# Patient Record
Sex: Male | Born: 1945
Health system: Southern US, Community
[De-identification: ages and names within clinical notes are randomized; demographics above are authoritative.]

## PROBLEM LIST (undated history)

## (undated) DIAGNOSIS — M5412 Radiculopathy, cervical region: Secondary | ICD-10-CM

## (undated) DIAGNOSIS — Z86010 Personal history of colonic polyps: Secondary | ICD-10-CM

## (undated) DIAGNOSIS — M1990 Unspecified osteoarthritis, unspecified site: Secondary | ICD-10-CM

## (undated) DIAGNOSIS — R7611 Nonspecific reaction to tuberculin skin test without active tuberculosis: Secondary | ICD-10-CM

## (undated) DIAGNOSIS — Z860101 Personal history of adenomatous and serrated colon polyps: Secondary | ICD-10-CM

## (undated) DIAGNOSIS — R310 Gross hematuria: Secondary | ICD-10-CM

## (undated) DIAGNOSIS — I219 Acute myocardial infarction, unspecified: Secondary | ICD-10-CM

## (undated) DIAGNOSIS — I10 Essential (primary) hypertension: Secondary | ICD-10-CM

## (undated) DIAGNOSIS — J302 Other seasonal allergic rhinitis: Secondary | ICD-10-CM

## (undated) DIAGNOSIS — E785 Hyperlipidemia, unspecified: Secondary | ICD-10-CM

## (undated) DIAGNOSIS — H269 Unspecified cataract: Secondary | ICD-10-CM

## (undated) DIAGNOSIS — M5090 Cervical disc disorder, unspecified, unspecified cervical region: Secondary | ICD-10-CM

## (undated) DIAGNOSIS — R3915 Urgency of urination: Secondary | ICD-10-CM

## (undated) DIAGNOSIS — I2510 Atherosclerotic heart disease of native coronary artery without angina pectoris: Secondary | ICD-10-CM

## (undated) DIAGNOSIS — Z955 Presence of coronary angioplasty implant and graft: Secondary | ICD-10-CM

## (undated) DIAGNOSIS — I252 Old myocardial infarction: Secondary | ICD-10-CM

## (undated) DIAGNOSIS — N323 Diverticulum of bladder: Secondary | ICD-10-CM

## (undated) DIAGNOSIS — T7840XA Allergy, unspecified, initial encounter: Secondary | ICD-10-CM

## (undated) DIAGNOSIS — R011 Cardiac murmur, unspecified: Secondary | ICD-10-CM

## (undated) HISTORY — PX: TONSILLECTOMY AND ADENOIDECTOMY: SUR1326

## (undated) HISTORY — DX: Acute myocardial infarction, unspecified: I21.9

## (undated) HISTORY — DX: Allergy, unspecified, initial encounter: T78.40XA

## (undated) HISTORY — DX: Unspecified cataract: H26.9

## (undated) HISTORY — PX: COLONOSCOPY: SHX174

## (undated) HISTORY — DX: Cervical disc disorder, unspecified, unspecified cervical region: M50.90

## (undated) HISTORY — PX: CORONARY ANGIOPLASTY WITH STENT PLACEMENT: SHX49

## (undated) HISTORY — DX: Nonspecific reaction to tuberculin skin test without active tuberculosis: R76.11

## (undated) HISTORY — DX: Hyperlipidemia, unspecified: E78.5

## (undated) HISTORY — PX: CATARACT EXTRACTION W/ INTRAOCULAR LENS  IMPLANT, BILATERAL: SHX1307

## (undated) HISTORY — DX: Atherosclerotic heart disease of native coronary artery without angina pectoris: I25.10

## (undated) HISTORY — DX: Personal history of colonic polyps: Z86.010

---

## 1970-06-11 HISTORY — PX: WISDOM TOOTH EXTRACTION: SHX21

## 2001-05-14 ENCOUNTER — Encounter: Payer: Self-pay | Admitting: Neurology

## 2001-05-14 ENCOUNTER — Ambulatory Visit (HOSPITAL_COMMUNITY): Admission: RE | Admit: 2001-05-14 | Discharge: 2001-05-14 | Payer: Self-pay | Admitting: Neurology

## 2004-02-16 ENCOUNTER — Encounter: Admission: RE | Admit: 2004-02-16 | Discharge: 2004-02-16 | Payer: Self-pay | Admitting: Family Medicine

## 2006-02-20 ENCOUNTER — Encounter: Payer: Self-pay | Admitting: Family Medicine

## 2006-02-21 ENCOUNTER — Ambulatory Visit: Payer: Self-pay | Admitting: Family Medicine

## 2006-03-01 ENCOUNTER — Ambulatory Visit: Payer: Self-pay | Admitting: Gastroenterology

## 2008-04-22 ENCOUNTER — Ambulatory Visit: Payer: Self-pay | Admitting: Internal Medicine

## 2008-04-22 LAB — CONVERTED CEMR LAB
ALT: 30 units/L (ref 0–53)
AST: 26 units/L (ref 0–37)
Albumin: 4.1 g/dL (ref 3.5–5.2)
Alkaline Phosphatase: 34 units/L — ABNORMAL LOW (ref 39–117)
Bilirubin, Direct: 0.1 mg/dL (ref 0.0–0.3)
Hgb A1c MFr Bld: 5.7 % (ref 4.6–6.0)
PSA: 1.79 ng/mL (ref 0.10–4.00)
Total Bilirubin: 0.9 mg/dL (ref 0.3–1.2)
Total Protein: 6.8 g/dL (ref 6.0–8.3)

## 2009-02-11 ENCOUNTER — Telehealth: Payer: Self-pay | Admitting: Family Medicine

## 2010-01-27 ENCOUNTER — Encounter: Payer: Self-pay | Admitting: Family Medicine

## 2010-02-22 ENCOUNTER — Ambulatory Visit: Payer: Self-pay | Admitting: Family Medicine

## 2010-02-22 ENCOUNTER — Encounter: Payer: Self-pay | Admitting: Family Medicine

## 2010-02-22 DIAGNOSIS — J209 Acute bronchitis, unspecified: Secondary | ICD-10-CM | POA: Insufficient documentation

## 2010-02-22 LAB — CONVERTED CEMR LAB
Basophils Absolute: 0 10*3/uL (ref 0.0–0.1)
Basophils Relative: 0.3 % (ref 0.0–3.0)
Eosinophils Absolute: 0.2 10*3/uL (ref 0.0–0.7)
Eosinophils Relative: 2.8 % (ref 0.0–5.0)
HCT: 41.1 % (ref 39.0–52.0)
Hemoglobin: 14.4 g/dL (ref 13.0–17.0)
Lymphocytes Relative: 19.8 % (ref 12.0–46.0)
Lymphs Abs: 1.5 10*3/uL (ref 0.7–4.0)
MCHC: 34.9 g/dL (ref 30.0–36.0)
MCV: 93.5 fL (ref 78.0–100.0)
Monocytes Absolute: 0.9 10*3/uL (ref 0.1–1.0)
Monocytes Relative: 11.1 % (ref 3.0–12.0)
Neutro Abs: 5.1 10*3/uL (ref 1.4–7.7)
Neutrophils Relative %: 66 % (ref 43.0–77.0)
Platelets: 240 10*3/uL (ref 150.0–400.0)
RBC: 4.4 M/uL (ref 4.22–5.81)
RDW: 13.8 % (ref 11.5–14.6)
WBC: 7.8 10*3/uL (ref 4.5–10.5)

## 2010-03-30 ENCOUNTER — Encounter: Payer: Self-pay | Admitting: Physician Assistant

## 2010-03-30 ENCOUNTER — Encounter: Payer: Self-pay | Admitting: Family Medicine

## 2010-06-09 ENCOUNTER — Encounter: Payer: Self-pay | Admitting: Internal Medicine

## 2010-06-09 ENCOUNTER — Ambulatory Visit
Admission: RE | Admit: 2010-06-09 | Discharge: 2010-06-09 | Payer: Self-pay | Source: Home / Self Care | Attending: Internal Medicine | Admitting: Internal Medicine

## 2010-07-11 NOTE — Miscellaneous (Signed)
Summary: Orders Update  Clinical Lists Changes  Problems: Added new problem of ACUTE BRONCHITIS (ICD-466.0) Orders: Added new Test order of T-2 View CXR (71020TC) - Signed

## 2010-07-11 NOTE — Miscellaneous (Signed)
Summary: Orders Update  Clinical Lists Changes  Medications: Added new medication of PREDNISONE 10 MG TABS (PREDNISONE) as directed - Signed Rx of PREDNISONE 10 MG TABS (PREDNISONE) as directed;  #30 x 0;  Signed;  Entered by: Loreen Freud DO;  Authorized by: Loreen Freud DO;  Method used: Electronically to [facility] [address], [place], [place], [place]  [address], Ph: [phone], Fax: [phone]    Prescriptions: PREDNISONE 10 MG TABS (PREDNISONE) as directed  #30 x 0   Entered and Authorized by:   Loreen Freud DO   Signed by:   Loreen Freud DO on 01/27/2010   Method used:   Electronically to        [facility]       [address]       [place], [place]  [address]       Ph: [phone]       Fax: [phone]   RxID:   [id number]

## 2010-07-11 NOTE — Miscellaneous (Signed)
Summary: Orders Update  Clinical Lists Changes  Problems: Added new problem of PREVENTIVE HEALTH CARE (ICD-V70.0) Orders: Added new Service order of Venipuncture (46962) - Signed Added new Test order of T- * Misc. Laboratory test [id number]) - Signed

## 2010-07-13 NOTE — Assessment & Plan Note (Signed)
Summary: Shingles Vaccine  Nurse Visit   Immunizations Administered:  Zostavax # 1:    Vaccine Type: Zostavax    Site: Left Arm    Mfr: Merck    Dose: 0.64mL    Route: Ward    Given by: Shonna Chock CMA    Exp. Date: 04/12/2011    Lot #: [id number]    VIS given: 03/23/05 given June 09, 2010.  Orders Added: 1)  Zoster (Shingles) Vaccine Live [[id number]] 2)  Admin 1st Vaccine [phone]

## 2010-10-27 NOTE — Assessment & Plan Note (Signed)
[facility]                           GASTROENTEROLOGY OFFICE NOTE   Robert Owen                       MRN:          [id number]  DATE:03/01/2006                            DOB:          Jul 14, 1945    PROBLEM:  Rectal mass.   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  He has no rectal pain or bleeding.  He underwent  colonoscopy on 2005 that was entirely normal.   PAST MEDICAL HISTORY:  Pertinent for hypertension.   FAMILY HISTORY:  Noncontributory.   MEDICATIONS:  Include Lipitor and a multivitamin.   ALLERGIES:  He had no allergies to oral medications.   He does not smoke.  He drinks occasionally.  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   REVIEW OF SYSTEMS:  Was reviewed, and is negative.   PHYSICAL EXAMINATION:  Pulse 68, blood pressure 100/50, weight 161.  HEENT: EOMI. PERRLA. Sclerae are anicteric.  Conjunctivae are pink.  NECK:  Supple without thyromegaly, adenopathy or carotid bruits.  CHEST:  Clear to auscultation and percussion without adventitious sounds.  CARDIAC:  Regular rhythm; normal S1 S2.  There are no murmurs, gallops or  rubs.  ABDOMEN:  Bowel sounds are normoactive.  Abdomen is soft, non-tender and non-  distended.  There are no abdominal masses, tenderness, splenic enlargement  or hepatomegaly.  EXTREMITIES:  Full range of motion.  No cyanosis, clubbing or edema.  RECTAL:  There are no masses.  Stool is Hemoccult negative.   Anoscopy was performed, and demonstrated a hemorrhoid at approximately 5  o'clock.   IMPRESSION:  Asymptomatic hemorrhoid.   RECOMMENDATION:  Anusol suppositories as needed.                                  [name]. Arlyce Dice, MD,FACG   RDK/MedQ  DD:  03/01/2006  DT:  03/03/2006  Job #:  [id number]

## 2010-10-28 ENCOUNTER — Ambulatory Visit (INDEPENDENT_AMBULATORY_CARE_PROVIDER_SITE_OTHER): Payer: Self-pay | Admitting: Family Medicine

## 2010-10-28 VITALS — BP 150/66 | HR 63 | Temp 97.6°F | Wt 167.0 lb

## 2010-10-28 DIAGNOSIS — M25559 Pain in unspecified hip: Secondary | ICD-10-CM

## 2010-10-28 DIAGNOSIS — M79609 Pain in unspecified limb: Secondary | ICD-10-CM

## 2010-10-28 DIAGNOSIS — M79651 Pain in right thigh: Secondary | ICD-10-CM

## 2010-10-28 MED ORDER — PREDNISONE (PAK) 10 MG PO TABS: ORAL_TABLET | ORAL | Status: DC

## 2010-10-28 NOTE — Patient Instructions (Signed)
We will call you regarding MRI appointment.

## 2010-10-29 NOTE — Progress Notes (Signed)
  Subjective:    Patient ID: Robert Owen, male    DOB: 09/11/1945, 65 y.o.   MRN: [id number]  HPI Patient seen with 2 month history of progressive R lateral hip pain.  No known injury.  Achy to sharp quality of pain R lateral hip and R lateral thigh and R lateral/volar foot near mid aspect.  Symptoms seem worse at night.  Pain first noted after riding stationary bike.  Worse R lateral decubitus.  Has tried Celebrex, Tylenol, and ASA without relief.  Pain seems to be progressing.  No associate appetite or weight changes.  No weakness.   Denies fever, chills, urine or stool incontinence.  No prior history of lumbar disc problems.  Review of Systems  Constitutional: Negative for fever, chills, activity change, appetite change and unexpected weight change.  Cardiovascular: Negative for leg swelling.  Genitourinary: Negative for dysuria and hematuria.  Neurological: Negative for weakness.  Hematological: Negative for adenopathy. Does not bruise/bleed easily.       Objective:   Physical Exam  Constitutional: He appears well-developed and well-nourished.  Cardiovascular: Normal rate and regular rhythm.   Musculoskeletal: He exhibits no edema.       Full ROM R hip.  Slighlty tender lateral hip over trochanteric bursa.  SLRs negative.  No muscle atrophy.  Neurological:       Normal sensory to touch.  Full strength with hip flexion, plantar and dorsiflexion.  DTRs 2 plus knee and ankle.  Skin: No rash noted.          Assessment & Plan:  R lower extremity pain.  Predominantly R lateral hip but also mid R thigh and foot suggests lumbar nerve root impingement.  No suspicion for R hip intraarticular pathology.  No focal weakness or atrophy at this time but symptoms are progressing.  Prednisone taper and MRI Lumbar spine.

## 2010-10-30 ENCOUNTER — Telehealth: Payer: Self-pay | Admitting: *Deleted

## 2010-10-30 DIAGNOSIS — M25559 Pain in unspecified hip: Secondary | ICD-10-CM

## 2010-10-30 NOTE — Telephone Encounter (Signed)
Dr Caryl Never ordered at  [facility], Camelia Eng did not receive the order/referral

## 2010-11-02 ENCOUNTER — Ambulatory Visit
Admission: RE | Admit: 2010-11-02 | Discharge: 2010-11-02 | Disposition: A | Discharge status: Home / Self Care | Payer: Commercial Managed Care - PPO | Source: Ambulatory Visit | Attending: Family Medicine | Admitting: Family Medicine

## 2010-11-02 DIAGNOSIS — M25559 Pain in unspecified hip: Secondary | ICD-10-CM

## 2011-02-07 ENCOUNTER — Encounter: Payer: Self-pay | Admitting: Gastroenterology

## 2011-06-12 DIAGNOSIS — I219 Acute myocardial infarction, unspecified: Secondary | ICD-10-CM

## 2011-06-12 HISTORY — DX: Acute myocardial infarction, unspecified: I21.9

## 2011-09-06 ENCOUNTER — Encounter: Payer: Self-pay | Admitting: Physician Assistant

## 2011-09-25 ENCOUNTER — Other Ambulatory Visit (INDEPENDENT_AMBULATORY_CARE_PROVIDER_SITE_OTHER): Payer: Commercial Managed Care - PPO

## 2011-09-25 DIAGNOSIS — R7309 Other abnormal glucose: Secondary | ICD-10-CM

## 2011-09-25 DIAGNOSIS — E119 Type 2 diabetes mellitus without complications: Secondary | ICD-10-CM

## 2011-10-20 ENCOUNTER — Encounter: Payer: Self-pay | Admitting: Physician Assistant

## 2011-10-21 ENCOUNTER — Encounter: Payer: Self-pay | Admitting: Physician Assistant

## 2011-10-22 ENCOUNTER — Encounter: Payer: Self-pay | Admitting: Physician Assistant

## 2011-10-23 ENCOUNTER — Encounter: Payer: Self-pay | Admitting: Internal Medicine

## 2011-10-23 DIAGNOSIS — R7309 Other abnormal glucose: Secondary | ICD-10-CM | POA: Insufficient documentation

## 2011-10-23 DIAGNOSIS — E785 Hyperlipidemia, unspecified: Secondary | ICD-10-CM | POA: Insufficient documentation

## 2011-10-23 DIAGNOSIS — I2510 Atherosclerotic heart disease of native coronary artery without angina pectoris: Secondary | ICD-10-CM | POA: Insufficient documentation

## 2011-10-24 ENCOUNTER — Encounter: Payer: Self-pay | Admitting: *Deleted

## 2011-10-24 ENCOUNTER — Ambulatory Visit (INDEPENDENT_AMBULATORY_CARE_PROVIDER_SITE_OTHER): Payer: Commercial Managed Care - PPO | Admitting: Physician Assistant

## 2011-10-24 ENCOUNTER — Encounter: Payer: Self-pay | Admitting: Physician Assistant

## 2011-10-24 VITALS — BP 132/64 | HR 71 | Ht 67.0 in | Wt 160.0 lb

## 2011-10-24 DIAGNOSIS — E785 Hyperlipidemia, unspecified: Secondary | ICD-10-CM

## 2011-10-24 DIAGNOSIS — I2510 Atherosclerotic heart disease of native coronary artery without angina pectoris: Secondary | ICD-10-CM

## 2011-10-24 MED ORDER — ROSUVASTATIN CALCIUM 40 MG PO TABS: 20.0000 mg | ORAL_TABLET | Freq: Every day | ORAL | Status: DC

## 2011-10-24 MED ORDER — NITROGLYCERIN 0.4 MG SL SUBL: 0.4000 mg | SUBLINGUAL_TABLET | SUBLINGUAL | Status: DC | PRN

## 2011-10-24 NOTE — Progress Notes (Signed)
[address] [place], [place]  [address] Phone: [phone] Fax:  [phone]  Date:  10/24/2011   Name:  Robert Melnick, Robert Owen   DOB:  1946/03/11   MRN:  [id number]  PCP:  No primary provider on file.  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  He has a h/o hyperlipidemia and glucose intolerance.  He has noted exertional chest pain for the past 2 weeks.  He thought it was from acid reflux and was taking a PPI.  He went to his son's graduation at [employer] this past weekend and had worsening chest pain with radiation to his arm and jaw.  He went to the ED at [facility] and was admitted.  Troponin peaked at 4.4.  LHC was done by Dr. Octavio Manns.  This demonstrated single vessel disease with high grade lesions in the mid and distal RCA which were treated with 2 Promus DES.  EF is preserved at 65%.  He was discharged to home 10/22/11.  He is feeling ok.  Notes significant fatigue.  He denies further angina.  He has had some chest pain with lying supine at night that he describes as indigestion.  No dyspnea.  No orthopnea, PND.  No edema.  No syncope.  Right radial site ok.  He does admit to drinking occasion red wine and chocolate and not taking Nexium on a regular basis.     Past Medical History  Diagnosis Date  . Hyperlipidemia   . Other abnormal glucose     A1c normal  . Elevated blood pressure reading without diagnosis of hypertension 10/20/11    BP 180/96 in ER  with NSTEMI    . CAD (coronary artery disease)     s/p NSTEMI 10/20/11: LHC 10/21/11 at [facility] in [place], [place]:  mLAD 20-30%, minor plaque in dLAD and Dx, CFX with minor plaquing, mRCA 70% and 80% hazy, dRCA 90%, EF 65%;  PCI: Promus DES x 2 (3x38 mm and 3x12 mm) to mid and distal RCA  . Cervical disc disease     Past Surgical History   Procedure Date  . Tonsillectomy and adenoidectomy   . Wisdom tooth extraction 1972  . Cardiac catheterization 10/21/11    [place] , [place]  . Colonoscopy      Current Outpatient Prescriptions  Medication Sig Dispense Refill  . aspirin 81 MG tablet Take 81 mg by mouth daily.      Marland Kitchen esomeprazole (NEXIUM) 40 MG capsule Take 40 mg by mouth daily before breakfast.      . prasugrel (EFFIENT) 10 MG TABS Take 10 mg by mouth daily.      . rosuvastatin (CRESTOR) 40 MG tablet Take 0.5 tablets (20 mg total) by mouth daily.  90 tablet  3  . DISCONTD: rosuvastatin (CRESTOR) 20 MG tablet Take 20 mg by mouth daily.        . metoprolol tartrate (LOPRESSOR) 25 MG tablet Take 1 tablet (25 mg total) by mouth 2 (two) times daily.      . nitroGLYCERIN (NITROSTAT) 0.4 MG SL tablet Place 1 tablet (0.4 mg total) under the tongue every 5 (five) minutes as needed for chest pain.  25 tablet  3    Allergies: Allergies  Allergen Reactions  . Eugenol     History  Substance Use  Topics  . Smoking status: Never Smoker   . Smokeless tobacco: Never Used  . Alcohol Use: Yes     4-8 drinks socially on weekends     Family History  Problem Relation Age of Onset  . Diabetes Sister     weight related  . Diabetes Brother     NIDDM  . Heart attack Father 13    2 ppd smoker  . Alcohol abuse Father   . Alcohol abuse Mother   . Dementia Paternal Grandmother     in 73s  . Rheum arthritis Paternal Grandfather      ROS:  Please see the history of present illness.   Denies melena, hematochezia.  Has noted some epistaxis.     PHYSICAL EXAM: VS:  BP 132/64  Pulse 71  Ht 5\' 7"  (1.702 m)  Wt 160 lb (72.576 kg)  BMI 25.06 kg/m2 Well nourished, well developed, in no acute distress HEENT: normal Neck: no JVD Vascular: no carotid bruits Cardiac:  normal S1, S2; RRR; no murmur Lungs:  clear to auscultation bilaterally, no wheezing, rhonchi or rales Abd: soft, nontender, no hepatomegaly Ext: no edema; right radial  site without hematoma or bruit  Skin: warm and dry Neuro:  CNs 2-12 intact, no focal abnormalities noted Psych: normal affect   EKG:  NSR, HR 71, normal axis, inf Q waves, TW inversions in 2, 3, aVF, V6 (suspect evolutionary changes)   ASSESSMENT AND PLAN:  1.  Coronary Artery Disease.   -  I recommend he establish with Dr. [name] as his cardiologist.     -  Continue ASA and Effient.  We discussed the need to continue dual antiplatelet Rx for one year post ACS.   -  He is eager to start cardiac rehab.  I will make that referral.   -  He has a NP student next week to work with him for 2 days.  I have recommended he work 1/2 days for his first week back.  He can work 1/2 day on Monday, Tuesday and Friday (9a-12p) and full days on Wednesday and Thursday (9a-12p and 1p-4p).  Then he will go back to full days.   -  Follow up with Dr. [name] in 6 weeks.   2.  Hyperlipidemia   -  Increase Crestor to 40 mg daily.   -  Repeat FLP and LFTs in 6 weeks.    3.  GERD   -  He has single vessel disease on his cath which was treated with PCI.  I suspect his current chest symptoms are related to acid reflux.   -  He will watch his diet and take Nexium on a regular basis.      Signed, Tereso Newcomer, PA-C  3:56 PM 10/24/2011

## 2011-10-24 NOTE — Patient Instructions (Addendum)
Your physician recommends that you schedule a follow-up appointment in: 6 WEEKS WITH DR. Excell Seltzer  You have been referred to CARDIAC REHAB DX HTN, CAD  INCREASE CRESTOR 40 MG QHS  FASTING LIPID/LIVER PANEL 272.4 12/05/11

## 2011-10-25 ENCOUNTER — Other Ambulatory Visit: Payer: Self-pay | Admitting: *Deleted

## 2011-10-25 ENCOUNTER — Encounter (HOSPITAL_COMMUNITY)
Admission: RE | Admit: 2011-10-25 | Discharge: 2011-10-25 | Disposition: A | Discharge status: Home / Self Care | Payer: 59 | Source: Ambulatory Visit | Attending: Cardiovascular Disease | Admitting: Cardiovascular Disease

## 2011-10-25 ENCOUNTER — Telehealth: Payer: Self-pay | Admitting: Physician Assistant

## 2011-10-25 ENCOUNTER — Telehealth: Payer: Self-pay | Admitting: *Deleted

## 2011-10-25 ENCOUNTER — Encounter: Payer: Self-pay | Admitting: Internal Medicine

## 2011-10-25 DIAGNOSIS — Z5189 Encounter for other specified aftercare: Secondary | ICD-10-CM | POA: Insufficient documentation

## 2011-10-25 DIAGNOSIS — I252 Old myocardial infarction: Secondary | ICD-10-CM | POA: Insufficient documentation

## 2011-10-25 DIAGNOSIS — E785 Hyperlipidemia, unspecified: Secondary | ICD-10-CM | POA: Insufficient documentation

## 2011-10-25 DIAGNOSIS — I2510 Atherosclerotic heart disease of native coronary artery without angina pectoris: Secondary | ICD-10-CM | POA: Insufficient documentation

## 2011-10-25 DIAGNOSIS — K219 Gastro-esophageal reflux disease without esophagitis: Secondary | ICD-10-CM | POA: Insufficient documentation

## 2011-10-25 DIAGNOSIS — Z9861 Coronary angioplasty status: Secondary | ICD-10-CM | POA: Insufficient documentation

## 2011-10-25 MED ORDER — METOPROLOL TARTRATE 25 MG PO TABS: 25.0000 mg | ORAL_TABLET | Freq: Two times a day (BID) | ORAL | Status: DC

## 2011-10-25 MED ORDER — ROSUVASTATIN CALCIUM 40 MG PO TABS: 40.0000 mg | ORAL_TABLET | Freq: Every day | ORAL | Status: DC

## 2011-10-25 MED ORDER — PRASUGREL HCL 10 MG PO TABS: 10.0000 mg | ORAL_TABLET | Freq: Every day | ORAL | Status: DC

## 2011-10-25 NOTE — Telephone Encounter (Signed)
S/w Dr. Alwyn Ren this morning. I notified Dr. Alwyn Ren that I hand faxed as well as faxed letter/work note through Epic to Dr. Felicity Coyer and to his office today per his request.

## 2011-10-25 NOTE — Telephone Encounter (Signed)
rx's sent over to [facility]

## 2011-10-25 NOTE — Progress Notes (Signed)
Cardiac Rehab Medication Review by a Pharmacist  Does the patient  feel that his/her medications are working for him/her?  yes  Has the patient been experiencing any side effects to the medications prescribed?  no  Does the patient measure his/her own blood pressure or blood glucose at home?  no   Does the patient have any problems obtaining medications due to transportation or finances?   no  Understanding of regimen: excellent Understanding of indications: excellent Potential of compliance: excellent    Pharmacist comments:     Wynonia Hazard 10/25/2011 9:03 AM

## 2011-10-25 NOTE — Telephone Encounter (Signed)
s/w Dr. Alwyn Ren this morning to advise did send over letter/work note to Dr. Felicity Coyer and to his office., told him I wil let him know when we rcv the rcds from [facility]

## 2011-10-25 NOTE — Telephone Encounter (Signed)
New problem:    Dr. Alwyn Ren calling, requesting letter to be faxed to his office fax #  [phone] and to Dr. Felicity Coyer [phone].

## 2011-10-29 ENCOUNTER — Encounter (HOSPITAL_COMMUNITY)
Admission: RE | Admit: 2011-10-29 | Discharge: 2011-10-29 | Disposition: A | Discharge status: Home / Self Care | Payer: 59 | Source: Ambulatory Visit | Attending: Cardiovascular Disease | Admitting: Cardiovascular Disease

## 2011-10-29 NOTE — Progress Notes (Signed)
Pt started cardiac rehab today.  Pt tolerated light exercise without difficulty. Monitor showed Sr with slight st depression and one rare PVC.  Continue to monitor.

## 2011-10-31 ENCOUNTER — Encounter (HOSPITAL_COMMUNITY)
Admission: RE | Admit: 2011-10-31 | Discharge: 2011-10-31 | Disposition: A | Discharge status: Home / Self Care | Payer: 59 | Source: Ambulatory Visit | Attending: Cardiovascular Disease | Admitting: Cardiovascular Disease

## 2011-10-31 NOTE — Progress Notes (Signed)
Marga Melnick, MD 66 y.o. male       Nutrition Screen                                                                    YES  NO Do you live in a nursing home?  X   Do you eat out more than 3 times/week?    X If yes, how many times per week do you eat out? Up to 3  times a week  Do you have food allergies?   X If yes, what are you allergic to?  Have you gained or lost more than 10 lbs without trying?               X If yes, how much weight have you lost and over what time period?    Do you want to lose weight?     X If yes, what is a goal weight or amount of weight you would like to lose?   Do you eat alone most of the time?   X   Do you eat less than 2 meals/day? X  If yes, how many meals do you eat?    Do you drink more than 3 alcohol drinks/day?  X If yes, how many drinks per day? On weekends  Are you having trouble with constipation? *  X If yes, what are you doing to help relieve constipation?  Do you have financial difficulties with buying food?*    X   Are you experiencing regular nausea/ vomiting?*     X   Do you have a poor appetite? *                                        X   Do you have trouble chewing/swallowing? *   X    Pt with diagnoses of:  x Stent/ PTCA x GERD          x Dyslipidemia  / HDL< 40 / LDL>70 / High TG      x %  Body fat >goal / Body Mass Index >25 x MI x Pre-diabetes       Pt Risk Score   1       Diagnosis Risk Score  30       Total Risk Score   31                         High Risk               x Low Risk    HT: 67" Ht Readings from Last 1 Encounters:  10/25/11 5\' 7"  (1.702 m)    WT:   161.5 lb (73.4 kg) Wt Readings from Last 3 Encounters:  10/25/11 161 lb 13.1 oz (73.4 kg)  10/24/11 160 lb (72.576 kg)  10/28/10 167 lb (75.751 kg)     IBW 67.3 109%IBW BMI 25.3 23.9%body fat  Meds reviewed. Past Medical History  Diagnosis Date  . Hyperlipidemia   . Other abnormal glucose     A1c normal  . Blood pressure elevated without history of HTN  10/20/11    BP 180/96 in ER  with NSTEMI    . CAD (coronary artery disease)      S/P NSTEMI 10/20/11: LHC 10/21/11 at [facility] in [place], [place]:  mLAD 20-30%, minor plaque in dLAD and Dx, CFX with minor plaquing, mRCA 70% and 80% hazy, dRCA 90%, EF 65%;  PCI: Promus DES x 2 (3x38 mm and 3x12 mm) to mid and distal RCA  . Cervical disc disease     C 6 L radiculopathy       Activity level: Pt is moderately active  Wt goal: 161 lb ( 73.4 kg) Current tobacco use? No Food/Drug Interaction? No Labs:  Lab Results  Component Value Date   HGBA1C 5.8 09/25/2011  09/06/11 Cholesterol 192 TG 104 HDL 60 LDL 111 09/06/11 Glucose 112  LDL goal:< 70      MI and > 2:  family h/o, > 66 yo male Estimated Daily Nutrition Needs for: ? wt maintenance 2100-2350 Kcal , Total Fat 65-75gm, Saturated Fat 15-18 gm, Trans Fat 2.2-2.6 gm,  Sodium less than 1500 mg

## 2011-11-02 ENCOUNTER — Encounter (HOSPITAL_COMMUNITY)
Admission: RE | Admit: 2011-11-02 | Discharge: 2011-11-02 | Disposition: A | Discharge status: Home / Self Care | Payer: 59 | Source: Ambulatory Visit | Attending: Cardiovascular Disease | Admitting: Cardiovascular Disease

## 2011-11-07 ENCOUNTER — Encounter (HOSPITAL_COMMUNITY)
Admission: RE | Admit: 2011-11-07 | Discharge: 2011-11-07 | Disposition: A | Discharge status: Home / Self Care | Payer: 59 | Source: Ambulatory Visit | Attending: Cardiovascular Disease | Admitting: Cardiovascular Disease

## 2011-11-09 ENCOUNTER — Encounter (HOSPITAL_COMMUNITY)
Admission: RE | Admit: 2011-11-09 | Discharge: 2011-11-09 | Disposition: A | Discharge status: Home / Self Care | Payer: 59 | Source: Ambulatory Visit | Attending: Cardiovascular Disease | Admitting: Cardiovascular Disease

## 2011-11-12 ENCOUNTER — Encounter (HOSPITAL_COMMUNITY)
Admission: RE | Admit: 2011-11-12 | Discharge: 2011-11-12 | Disposition: A | Discharge status: Home / Self Care | Payer: 59 | Source: Ambulatory Visit | Attending: Cardiovascular Disease | Admitting: Cardiovascular Disease

## 2011-11-12 DIAGNOSIS — Z9861 Coronary angioplasty status: Secondary | ICD-10-CM | POA: Insufficient documentation

## 2011-11-12 DIAGNOSIS — K219 Gastro-esophageal reflux disease without esophagitis: Secondary | ICD-10-CM | POA: Insufficient documentation

## 2011-11-12 DIAGNOSIS — Z5189 Encounter for other specified aftercare: Secondary | ICD-10-CM | POA: Insufficient documentation

## 2011-11-12 DIAGNOSIS — I252 Old myocardial infarction: Secondary | ICD-10-CM | POA: Insufficient documentation

## 2011-11-12 DIAGNOSIS — I2510 Atherosclerotic heart disease of native coronary artery without angina pectoris: Secondary | ICD-10-CM | POA: Insufficient documentation

## 2011-11-12 DIAGNOSIS — E785 Hyperlipidemia, unspecified: Secondary | ICD-10-CM | POA: Insufficient documentation

## 2011-11-14 ENCOUNTER — Encounter (HOSPITAL_COMMUNITY)
Admission: RE | Admit: 2011-11-14 | Discharge: 2011-11-14 | Disposition: A | Discharge status: Home / Self Care | Payer: 59 | Source: Ambulatory Visit | Attending: Cardiovascular Disease | Admitting: Cardiovascular Disease

## 2011-11-16 ENCOUNTER — Encounter (HOSPITAL_COMMUNITY)
Admission: RE | Admit: 2011-11-16 | Discharge: 2011-11-16 | Disposition: A | Discharge status: Home / Self Care | Payer: 59 | Source: Ambulatory Visit | Attending: Cardiovascular Disease | Admitting: Cardiovascular Disease

## 2011-11-19 ENCOUNTER — Encounter (HOSPITAL_COMMUNITY)
Admission: RE | Admit: 2011-11-19 | Discharge: 2011-11-19 | Disposition: A | Discharge status: Home / Self Care | Payer: 59 | Source: Ambulatory Visit | Attending: Cardiovascular Disease | Admitting: Cardiovascular Disease

## 2011-11-19 ENCOUNTER — Other Ambulatory Visit: Payer: Self-pay | Admitting: Family Medicine

## 2011-11-19 DIAGNOSIS — I2510 Atherosclerotic heart disease of native coronary artery without angina pectoris: Secondary | ICD-10-CM

## 2011-11-19 MED ORDER — PRASUGREL HCL 10 MG PO TABS: 10.0000 mg | ORAL_TABLET | Freq: Every day | ORAL | Status: DC

## 2011-11-21 ENCOUNTER — Encounter (HOSPITAL_COMMUNITY)
Admission: RE | Admit: 2011-11-21 | Discharge: 2011-11-21 | Disposition: A | Discharge status: Home / Self Care | Payer: 59 | Source: Ambulatory Visit | Attending: Cardiovascular Disease | Admitting: Cardiovascular Disease

## 2011-11-23 ENCOUNTER — Encounter (HOSPITAL_COMMUNITY)
Admission: RE | Admit: 2011-11-23 | Discharge: 2011-11-23 | Disposition: A | Discharge status: Home / Self Care | Payer: 59 | Source: Ambulatory Visit | Attending: Cardiovascular Disease | Admitting: Cardiovascular Disease

## 2011-11-26 ENCOUNTER — Encounter (HOSPITAL_COMMUNITY)
Admission: RE | Admit: 2011-11-26 | Discharge: 2011-11-26 | Disposition: A | Discharge status: Home / Self Care | Payer: 59 | Source: Ambulatory Visit | Attending: Cardiovascular Disease | Admitting: Cardiovascular Disease

## 2011-11-28 ENCOUNTER — Encounter (HOSPITAL_COMMUNITY)
Admission: RE | Admit: 2011-11-28 | Discharge: 2011-11-28 | Disposition: A | Discharge status: Home / Self Care | Payer: 59 | Source: Ambulatory Visit | Attending: Cardiovascular Disease | Admitting: Cardiovascular Disease

## 2011-11-30 ENCOUNTER — Encounter (HOSPITAL_COMMUNITY)
Admission: RE | Admit: 2011-11-30 | Discharge: 2011-11-30 | Disposition: A | Discharge status: Home / Self Care | Payer: 59 | Source: Ambulatory Visit | Attending: Cardiovascular Disease | Admitting: Cardiovascular Disease

## 2011-12-03 ENCOUNTER — Encounter (HOSPITAL_COMMUNITY)
Admission: RE | Admit: 2011-12-03 | Discharge: 2011-12-03 | Disposition: A | Discharge status: Home / Self Care | Payer: 59 | Source: Ambulatory Visit | Attending: Cardiovascular Disease | Admitting: Cardiovascular Disease

## 2011-12-05 ENCOUNTER — Encounter (HOSPITAL_COMMUNITY)
Admission: RE | Admit: 2011-12-05 | Discharge: 2011-12-05 | Disposition: A | Discharge status: Home / Self Care | Payer: 59 | Source: Ambulatory Visit | Attending: Cardiovascular Disease | Admitting: Cardiovascular Disease

## 2011-12-05 MED ORDER — HEPARIN SODIUM (PORCINE) 1000 UNIT/ML IJ SOLN
INTRAMUSCULAR | Status: AC
  Filled 2011-12-05: qty 1

## 2011-12-05 MED ORDER — LIDOCAINE HCL (PF) 1 % IJ SOLN
INTRAMUSCULAR | Status: AC
  Filled 2011-12-05: qty 30

## 2011-12-05 MED ORDER — FENTANYL CITRATE 0.05 MG/ML IJ SOLN
INTRAMUSCULAR | Status: AC
  Filled 2011-12-05: qty 2

## 2011-12-05 MED ORDER — MIDAZOLAM HCL 2 MG/2ML IJ SOLN
INTRAMUSCULAR | Status: AC
  Filled 2011-12-05: qty 2

## 2011-12-05 MED ORDER — HEPARIN (PORCINE) IN NACL 2-0.9 UNIT/ML-% IJ SOLN
INTRAMUSCULAR | Status: AC
  Filled 2011-12-05: qty 2000

## 2011-12-05 MED ORDER — NITROGLYCERIN 0.2 MG/ML ON CALL CATH LAB
INTRAVENOUS | Status: AC
  Filled 2011-12-05: qty 1

## 2011-12-05 MED ORDER — VERAPAMIL HCL 2.5 MG/ML IV SOLN
INTRAVENOUS | Status: AC
  Filled 2011-12-05: qty 2

## 2011-12-05 NOTE — Progress Notes (Signed)
Marga Melnick, MD 66 y.o. male Nutrition Note Spoke with pt.  Nutrition Plan, Nutrition Survey, and cholesterol goals reviewed with pt. Pt is following Step 2 of the Therapeutic Lifestyle Changes diet. Pre-diabetes discussed.   Nutrition Diagnosis   Food-and nutrition-related knowledge deficit related to lack of exposure to information as related to diagnosis of: ? CVD ? Pre-DM (A1c 5.8)    Nutrition RX/ Estimated Daily Nutrition Needs for: wt maintenance 2100-2350 Kcal, 65-75 gm fat, 15-18 gm sat fat, 2.2-2.6 gm trans-fat, <1500 mg sodium  Nutrition Intervention   Pt's individual nutrition plan including cholesterol goals reviewed with pt.   Benefits of adopting Therapeutic Lifestyle Changes discussed when Medficts reviewed.   Pt to attend the Portion Distortion class   Pt to attend the  ? Nutrition I class                     ? Nutrition II class   Pt given handouts for: ? pre-DM    Continue client-centered nutrition education by RD, as part of interdisciplinary care. Goal(s)   Pt to describe the benefit of including fruits, vegetables, whole grains, and low-fat dairy products in a heart healthy meal plan. Monitor and Evaluate progress toward nutrition goal with team.

## 2011-12-07 ENCOUNTER — Encounter (HOSPITAL_COMMUNITY)
Admission: RE | Admit: 2011-12-07 | Discharge: 2011-12-07 | Disposition: A | Discharge status: Home / Self Care | Payer: 59 | Source: Ambulatory Visit | Attending: Cardiovascular Disease | Admitting: Cardiovascular Disease

## 2011-12-10 ENCOUNTER — Encounter (HOSPITAL_COMMUNITY)
Admission: RE | Admit: 2011-12-10 | Discharge: 2011-12-10 | Disposition: A | Discharge status: Home / Self Care | Payer: 59 | Source: Ambulatory Visit | Attending: Cardiovascular Disease | Admitting: Cardiovascular Disease

## 2011-12-10 DIAGNOSIS — E785 Hyperlipidemia, unspecified: Secondary | ICD-10-CM | POA: Insufficient documentation

## 2011-12-10 DIAGNOSIS — Z5189 Encounter for other specified aftercare: Secondary | ICD-10-CM | POA: Insufficient documentation

## 2011-12-10 DIAGNOSIS — Z9861 Coronary angioplasty status: Secondary | ICD-10-CM | POA: Insufficient documentation

## 2011-12-10 DIAGNOSIS — I2510 Atherosclerotic heart disease of native coronary artery without angina pectoris: Secondary | ICD-10-CM | POA: Insufficient documentation

## 2011-12-10 DIAGNOSIS — I252 Old myocardial infarction: Secondary | ICD-10-CM | POA: Insufficient documentation

## 2011-12-10 DIAGNOSIS — K219 Gastro-esophageal reflux disease without esophagitis: Secondary | ICD-10-CM | POA: Insufficient documentation

## 2011-12-12 ENCOUNTER — Encounter (HOSPITAL_COMMUNITY)
Admission: RE | Admit: 2011-12-12 | Discharge: 2011-12-12 | Disposition: A | Discharge status: Home / Self Care | Payer: 59 | Source: Ambulatory Visit | Attending: Cardiovascular Disease | Admitting: Cardiovascular Disease

## 2011-12-14 ENCOUNTER — Encounter (HOSPITAL_COMMUNITY)
Admission: RE | Admit: 2011-12-14 | Discharge: 2011-12-14 | Disposition: A | Discharge status: Home / Self Care | Payer: 59 | Source: Ambulatory Visit | Attending: Cardiovascular Disease | Admitting: Cardiovascular Disease

## 2011-12-14 NOTE — Progress Notes (Signed)
Pt with noted increasing bp readings while exercising on the airdyne bike during 2nd station.  Other exercise bp are within normal limit for exercise.  The airdyne is not a preferred home exercise equipment and pr reports knee discomfort, plan to switch pt to treadmill for two stations then stepper.  Will send in basket message to Dr. Excell Seltzer regarding elevation and plan of change equipment.  Continue to monitor BP during exercise.

## 2011-12-17 ENCOUNTER — Encounter (HOSPITAL_COMMUNITY): Payer: 59

## 2011-12-19 ENCOUNTER — Encounter (HOSPITAL_COMMUNITY)
Admission: RE | Admit: 2011-12-19 | Discharge: 2011-12-19 | Disposition: A | Discharge status: Home / Self Care | Payer: 59 | Source: Ambulatory Visit | Attending: Cardiovascular Disease | Admitting: Cardiovascular Disease

## 2011-12-20 ENCOUNTER — Other Ambulatory Visit (INDEPENDENT_AMBULATORY_CARE_PROVIDER_SITE_OTHER): Payer: Commercial Managed Care - PPO

## 2011-12-20 ENCOUNTER — Telehealth: Payer: Self-pay | Admitting: *Deleted

## 2011-12-20 DIAGNOSIS — E785 Hyperlipidemia, unspecified: Secondary | ICD-10-CM

## 2011-12-20 DIAGNOSIS — T887XXA Unspecified adverse effect of drug or medicament, initial encounter: Secondary | ICD-10-CM

## 2011-12-20 LAB — AST: AST: 42 U/L — ABNORMAL HIGH (ref 0–37)

## 2011-12-20 LAB — LIPID PANEL
HDL: 61.5 mg/dL (ref >39.00)
Total CHOL/HDL Ratio: 3

## 2011-12-20 LAB — ALT: ALT: 54 U/L — ABNORMAL HIGH (ref 0–53)

## 2011-12-20 LAB — CK: Total CK: 117 U/L (ref 7–232)

## 2011-12-20 NOTE — Telephone Encounter (Signed)
Orders entered

## 2011-12-20 NOTE — Telephone Encounter (Signed)
Error

## 2011-12-20 NOTE — Progress Notes (Signed)
Labs only

## 2011-12-21 ENCOUNTER — Encounter (HOSPITAL_COMMUNITY)
Admission: RE | Admit: 2011-12-21 | Discharge: 2011-12-21 | Disposition: A | Discharge status: Home / Self Care | Payer: 59 | Source: Ambulatory Visit | Attending: Cardiovascular Disease | Admitting: Cardiovascular Disease

## 2011-12-21 ENCOUNTER — Ambulatory Visit (INDEPENDENT_AMBULATORY_CARE_PROVIDER_SITE_OTHER): Payer: Commercial Managed Care - PPO | Admitting: Cardiovascular Disease

## 2011-12-21 ENCOUNTER — Encounter: Payer: Self-pay | Admitting: Cardiovascular Disease

## 2011-12-21 VITALS — BP 144/76 | HR 59 | Ht 67.5 in | Wt 159.0 lb

## 2011-12-21 DIAGNOSIS — I2510 Atherosclerotic heart disease of native coronary artery without angina pectoris: Secondary | ICD-10-CM

## 2011-12-21 MED ORDER — NITROGLYCERIN 0.4 MG SL SUBL: 0.4000 mg | SUBLINGUAL_TABLET | SUBLINGUAL | Status: DC | PRN

## 2011-12-21 MED ORDER — ROSUVASTATIN CALCIUM 40 MG PO TABS: 40.0000 mg | ORAL_TABLET | Freq: Every day | ORAL | Status: DC

## 2011-12-21 NOTE — Progress Notes (Signed)
HPI:  'Robert Owen' returns for cardiology followup. He had a non-STEMI about 2 months ago when he was at his son's graduation in [place]. He was taken fairly urgently to the Cath Lab where he underwent treatment with overlapping drug-eluting stents the right coronary artery. I've reviewed his cath films and he did not have significant coronary disease elsewhere. Overall he is doing well. He is tolerating medical therapy but is bruising very easily on effient. He's had some fleeting pleuritic type chest pains but nothing similar to what he had at the time of his non-STEMI. He's been compliant with medications. He brings in blood pressure readings from cardiac rehabilitation and had some elevated readings when using the Aerodyne bike. Otherwise his blood pressures have been well-controlled. He's increased his Crestor dose to 40 mg daily. LFTs drawn yesterday show an ALT of 54 and an AST of 42.  Outpatient Encounter Prescriptions as of 12/21/2011  Medication Sig Dispense Refill  . aspirin 81 MG tablet Take 81 mg by mouth daily.      . metoprolol tartrate (LOPRESSOR) 25 MG tablet Take 1 tablet (25 mg total) by mouth 2 (two) times daily.  180 tablet  3  . nitroGLYCERIN (NITROSTAT) 0.4 MG SL tablet Place 1 tablet (0.4 mg total) under the tongue every 5 (five) minutes as needed for chest pain.  25 tablet  3  . prasugrel (EFFIENT) 10 MG TABS Take 1 tablet (10 mg total) by mouth daily.  90 tablet  3  . rosuvastatin (CRESTOR) 40 MG tablet Take 1 tablet (40 mg total) by mouth daily.  90 tablet  3  . DISCONTD: nitroGLYCERIN (NITROSTAT) 0.4 MG SL tablet Place 1 tablet (0.4 mg total) under the tongue every 5 (five) minutes as needed for chest pain.  25 tablet  3  . DISCONTD: rosuvastatin (CRESTOR) 40 MG tablet Take 1 tablet (40 mg total) by mouth daily.  90 tablet  3  . DISCONTD: esomeprazole (NEXIUM) 40 MG capsule Take 40 mg by mouth daily before breakfast.        Allergies  Allergen Reactions  . Eugenol      Past Medical History  Diagnosis Date  . Hyperlipidemia   . Other abnormal glucose     A1c normal  . Elevated blood pressure reading without diagnosis of hypertension 10/20/11    BP 180/96 in ER  with NSTEMI    . CAD (coronary artery disease)      S/P NSTEMI 10/20/11: LHC 10/21/11 at [facility] in [place], [place]:  mLAD 20-30%, minor plaque in dLAD and Dx, CFX with minor plaquing, mRCA 70% and 80% hazy, dRCA 90%, EF 65%;  PCI: Promus DES x 2 (3x38 mm and 3x12 mm) to mid and distal RCA  . Cervical disc disease     C 6 L radiculopathy   BP 144/76  Pulse 59  Ht 5' 7.5" (1.715 m)  Wt 72.122 kg (159 lb)  BMI 24.54 kg/m2  SpO2 99%  PHYSICAL EXAM: Pt is alert and oriented, NAD HEENT: normal Neck: JVP - normal, carotids 2+= without bruits Lungs: CTA bilaterally CV: RRR without murmur or gallop Abd: soft, NT, Positive BS, no hepatomegaly Ext: no C/C/E, distal pulses intact and equal Skin: warm/dry no rash  EKG: Normal sinus rhythm 59 beats per minute, otherwise within normal limits  ASSESSMENT AND PLAN: 1. CAD, native vessel. He seems to be doing quite well with cardiac rehabilitation. We reviewed medications today he'll continue on the same medical  therapy. I like to see him back in 6 months.  2. Hyperlipidemia. Will repeat LFTs in 3 months since they were mildly elevated on higher dose Crestor. Lipids are good.  3. Hypertension. Blood pressure readings reviewed and for the most part are in good range. His blood pressure when using the Aerodyne bike was elevated. He will continue on metoprolol. If he like to switch to metoprolol succinate down the road I think that would be reasonable.  [name] 12/21/2011 5:26 PM

## 2011-12-21 NOTE — Patient Instructions (Addendum)
Your physician wants you to follow-up in: 6 months with Dr Theodoro Parma will receive a reminder letter in the mail two months in advance. If you don't receive a letter, please call our office to schedule the follow-up appointment.  Your physician recommends that you return for lab work in: 3 months recheck LFT's

## 2011-12-24 ENCOUNTER — Encounter (HOSPITAL_COMMUNITY)
Admission: RE | Admit: 2011-12-24 | Discharge: 2011-12-24 | Disposition: A | Discharge status: Home / Self Care | Payer: 59 | Source: Ambulatory Visit | Attending: Cardiovascular Disease | Admitting: Cardiovascular Disease

## 2011-12-24 NOTE — Progress Notes (Signed)
Pt seen in the office last Friday by Dr. Excell Seltzer.  No new medication changes.  Continue to monitor pt.

## 2011-12-26 ENCOUNTER — Encounter (HOSPITAL_COMMUNITY)
Admission: RE | Admit: 2011-12-26 | Discharge: 2011-12-26 | Disposition: A | Discharge status: Home / Self Care | Payer: 59 | Source: Ambulatory Visit | Attending: Cardiovascular Disease | Admitting: Cardiovascular Disease

## 2011-12-28 ENCOUNTER — Encounter (HOSPITAL_COMMUNITY)
Admission: RE | Admit: 2011-12-28 | Discharge: 2011-12-28 | Disposition: A | Discharge status: Home / Self Care | Payer: 59 | Source: Ambulatory Visit | Attending: Cardiovascular Disease | Admitting: Cardiovascular Disease

## 2011-12-31 ENCOUNTER — Encounter (HOSPITAL_COMMUNITY)
Admission: RE | Admit: 2011-12-31 | Discharge: 2011-12-31 | Disposition: A | Discharge status: Home / Self Care | Payer: 59 | Source: Ambulatory Visit | Attending: Cardiovascular Disease | Admitting: Cardiovascular Disease

## 2012-01-02 ENCOUNTER — Encounter (HOSPITAL_COMMUNITY): Payer: 59

## 2012-01-04 ENCOUNTER — Encounter (HOSPITAL_COMMUNITY)
Admission: RE | Admit: 2012-01-04 | Discharge: 2012-01-04 | Disposition: A | Discharge status: Home / Self Care | Payer: 59 | Source: Ambulatory Visit | Attending: Cardiovascular Disease | Admitting: Cardiovascular Disease

## 2012-01-07 ENCOUNTER — Encounter (HOSPITAL_COMMUNITY)
Admission: RE | Admit: 2012-01-07 | Discharge: 2012-01-07 | Disposition: A | Discharge status: Home / Self Care | Payer: 59 | Source: Ambulatory Visit | Attending: Cardiovascular Disease | Admitting: Cardiovascular Disease

## 2012-01-09 ENCOUNTER — Encounter (HOSPITAL_COMMUNITY)
Admission: RE | Admit: 2012-01-09 | Discharge: 2012-01-09 | Disposition: A | Discharge status: Home / Self Care | Payer: 59 | Source: Ambulatory Visit | Attending: Cardiovascular Disease | Admitting: Cardiovascular Disease

## 2012-01-11 ENCOUNTER — Encounter (HOSPITAL_COMMUNITY)
Admission: RE | Admit: 2012-01-11 | Discharge: 2012-01-11 | Disposition: A | Discharge status: Home / Self Care | Payer: 59 | Source: Ambulatory Visit | Attending: Cardiovascular Disease | Admitting: Cardiovascular Disease

## 2012-01-11 DIAGNOSIS — I252 Old myocardial infarction: Secondary | ICD-10-CM | POA: Insufficient documentation

## 2012-01-11 DIAGNOSIS — Z5189 Encounter for other specified aftercare: Secondary | ICD-10-CM | POA: Insufficient documentation

## 2012-01-11 DIAGNOSIS — Z9861 Coronary angioplasty status: Secondary | ICD-10-CM | POA: Insufficient documentation

## 2012-01-11 DIAGNOSIS — I2510 Atherosclerotic heart disease of native coronary artery without angina pectoris: Secondary | ICD-10-CM | POA: Insufficient documentation

## 2012-01-11 DIAGNOSIS — K219 Gastro-esophageal reflux disease without esophagitis: Secondary | ICD-10-CM | POA: Insufficient documentation

## 2012-01-11 DIAGNOSIS — E785 Hyperlipidemia, unspecified: Secondary | ICD-10-CM | POA: Insufficient documentation

## 2012-01-14 ENCOUNTER — Encounter (HOSPITAL_COMMUNITY)
Admission: RE | Admit: 2012-01-14 | Discharge: 2012-01-14 | Disposition: A | Discharge status: Home / Self Care | Payer: 59 | Source: Ambulatory Visit | Attending: Cardiovascular Disease | Admitting: Cardiovascular Disease

## 2012-01-16 ENCOUNTER — Encounter (HOSPITAL_COMMUNITY)
Admission: RE | Admit: 2012-01-16 | Discharge: 2012-01-16 | Disposition: A | Discharge status: Home / Self Care | Payer: 59 | Source: Ambulatory Visit | Attending: Cardiovascular Disease | Admitting: Cardiovascular Disease

## 2012-01-18 ENCOUNTER — Encounter (HOSPITAL_COMMUNITY)
Admission: RE | Admit: 2012-01-18 | Discharge: 2012-01-18 | Disposition: A | Discharge status: Home / Self Care | Payer: 59 | Source: Ambulatory Visit | Attending: Cardiovascular Disease | Admitting: Cardiovascular Disease

## 2012-01-21 ENCOUNTER — Encounter (HOSPITAL_COMMUNITY)
Admission: RE | Admit: 2012-01-21 | Discharge: 2012-01-21 | Disposition: A | Discharge status: Home / Self Care | Payer: 59 | Source: Ambulatory Visit | Attending: Cardiovascular Disease | Admitting: Cardiovascular Disease

## 2012-01-23 ENCOUNTER — Encounter (HOSPITAL_COMMUNITY)
Admission: RE | Admit: 2012-01-23 | Discharge: 2012-01-23 | Disposition: A | Discharge status: Home / Self Care | Payer: 59 | Source: Ambulatory Visit | Attending: Cardiovascular Disease | Admitting: Cardiovascular Disease

## 2012-01-25 ENCOUNTER — Encounter (HOSPITAL_COMMUNITY)
Admission: RE | Admit: 2012-01-25 | Discharge: 2012-01-25 | Disposition: A | Discharge status: Home / Self Care | Payer: 59 | Source: Ambulatory Visit | Attending: Cardiovascular Disease | Admitting: Cardiovascular Disease

## 2012-01-25 NOTE — Progress Notes (Signed)
Pt graduates today.  Pt plans to continue his home exercise at Sport time.  Medication list reconciled.

## 2012-01-28 ENCOUNTER — Encounter (HOSPITAL_COMMUNITY): Payer: 59

## 2012-01-30 ENCOUNTER — Encounter (HOSPITAL_COMMUNITY): Payer: 59

## 2012-02-01 ENCOUNTER — Encounter (HOSPITAL_COMMUNITY): Payer: 59

## 2012-06-30 ENCOUNTER — Other Ambulatory Visit: Payer: Self-pay

## 2012-06-30 ENCOUNTER — Other Ambulatory Visit (INDEPENDENT_AMBULATORY_CARE_PROVIDER_SITE_OTHER): Payer: Commercial Managed Care - PPO

## 2012-06-30 DIAGNOSIS — E785 Hyperlipidemia, unspecified: Secondary | ICD-10-CM

## 2012-06-30 LAB — LIPID PANEL
Cholesterol: 154 mg/dL (ref 0–200)
HDL: 50.4 mg/dL (ref >39.00)
LDL Cholesterol: 79 mg/dL (ref 0–99)
Total CHOL/HDL Ratio: 3
Triglycerides: 125 mg/dL (ref 0.0–149.0)
VLDL: 25 mg/dL (ref 0.0–40.0)

## 2012-06-30 LAB — BASIC METABOLIC PANEL
CO2: 28 mEq/L (ref 19–32)
Chloride: 102 mEq/L (ref 96–112)
Potassium: 4.3 mEq/L (ref 3.5–5.1)
Sodium: 138 mEq/L (ref 135–145)

## 2012-06-30 LAB — HEPATIC FUNCTION PANEL
Albumin: 4.1 g/dL (ref 3.5–5.2)
Total Protein: 6.8 g/dL (ref 6.0–8.3)

## 2012-06-30 LAB — CK: Total CK: 147 U/L (ref 7–232)

## 2012-07-01 ENCOUNTER — Ambulatory Visit (INDEPENDENT_AMBULATORY_CARE_PROVIDER_SITE_OTHER): Payer: 59 | Admitting: Cardiovascular Disease

## 2012-07-01 ENCOUNTER — Encounter: Payer: Self-pay | Admitting: Cardiovascular Disease

## 2012-07-01 VITALS — BP 164/69 | HR 61 | Ht 67.5 in | Wt 161.4 lb

## 2012-07-01 DIAGNOSIS — I2510 Atherosclerotic heart disease of native coronary artery without angina pectoris: Secondary | ICD-10-CM

## 2012-07-01 DIAGNOSIS — E785 Hyperlipidemia, unspecified: Secondary | ICD-10-CM

## 2012-07-01 NOTE — Patient Instructions (Addendum)
Your physician has requested that you have an exercise tolerance test in MAY 2014. You will receive a reminder letter in the mail two months in advance. If you don't receive a letter, please call our office to schedule the follow-up appointment.  Your physician recommends that you continue on your current medications as directed. Please refer to the Current Medication list given to you today.

## 2012-07-01 NOTE — Progress Notes (Signed)
   HPI:  Robert Owen returns for follow-up evaluation. He is 67 years old and was diagnosed with CAD when he presented in May 2013 with a NSTEMI. He underwent PCI of the RCA with overlapping drug-eluting stents. He's done well without further episodes of chest discomfort. He's been through cardiac rehab. He denies chest pain, dyspnea, or edema. No palps or other complaints. He's compliant with meds.  Outpatient Encounter Prescriptions as of 07/01/2012  Medication Sig Dispense Refill  . aspirin 81 MG tablet Take 81 mg by mouth daily.      . metoprolol tartrate (LOPRESSOR) 25 MG tablet Take 1 tablet (25 mg total) by mouth 2 (two) times daily.  180 tablet  3  . nitroGLYCERIN (NITROSTAT) 0.4 MG SL tablet Place 1 tablet (0.4 mg total) under the tongue every 5 (five) minutes as needed for chest pain.  25 tablet  3  . prasugrel (EFFIENT) 10 MG TABS Take 1 tablet (10 mg total) by mouth daily.  90 tablet  3  . rosuvastatin (CRESTOR) 40 MG tablet Take 1 tablet (40 mg total) by mouth daily.  90 tablet  3    Allergies  Allergen Reactions  . Eugenol     Past Medical History  Diagnosis Date  . Hyperlipidemia   . Other abnormal glucose     A1c normal  . Elevated blood pressure reading without diagnosis of hypertension 10/20/11    BP 180/96 in ER  with NSTEMI    . CAD (coronary artery disease)      S/P NSTEMI 10/20/11: LHC 10/21/11 at [facility] in [place], [place]:  mLAD 20-30%, minor plaque in dLAD and Dx, CFX with minor plaquing, mRCA 70% and 80% hazy, dRCA 90%, EF 65%;  PCI: Promus DES x 2 (3x38 mm and 3x12 mm) to mid and distal RCA  . Cervical disc disease     C 6 L radiculopathy    ROS: Negative except as per HPI  BP 164/69  Pulse 61  Ht 5' 7.5" (1.715 m)  Wt 73.211 kg (161 lb 6.4 oz)  BMI 24.91 kg/m2  PHYSICAL EXAM: Pt is alert and oriented, NAD HEENT: normal Neck: JVP - normal, carotids 2+= without bruits Lungs: CTA bilaterally CV: RRR without murmur or gallop Abd: soft, NT,  Positive BS, no hepatomegaly Ext: no C/C/E, distal pulses intact and equal Skin: warm/dry no rash  EKG:  Sinus rhythm 59 bpm, within normal limits  ASSESSMENT AND PLAN: 1. CAD, native vessel. Stable without anginal symptoms. Will do an exercise treadmill study in May when he is out to 12 months from his infarct. Continue same meds for now. Consider change to plavix at 12 months versus discontinuation of DAPT at that point. Will discuss further when he returns for his treadmill.  2. Hyperlipidemia: lipids reviewed and excellent with LDL 79 and HDL 50. LFT's stable.  Follow-up as planned with [facility] in May 2014.  [name] 07/17/2012 1:26 PM

## 2012-07-05 ENCOUNTER — Encounter: Payer: Self-pay | Admitting: Cardiovascular Disease

## 2012-07-17 ENCOUNTER — Encounter: Payer: Self-pay | Admitting: Cardiovascular Disease

## 2012-08-08 ENCOUNTER — Encounter: Payer: Self-pay | Admitting: Internal Medicine

## 2012-10-24 ENCOUNTER — Encounter: Payer: 59 | Admitting: Internal Medicine

## 2012-10-24 ENCOUNTER — Ambulatory Visit (INDEPENDENT_AMBULATORY_CARE_PROVIDER_SITE_OTHER): Payer: 59 | Admitting: Internal Medicine

## 2012-10-24 ENCOUNTER — Encounter: Payer: Self-pay | Admitting: Cardiovascular Disease

## 2012-10-24 ENCOUNTER — Encounter: Payer: 59 | Admitting: Cardiovascular Disease

## 2012-10-24 DIAGNOSIS — I2510 Atherosclerotic heart disease of native coronary artery without angina pectoris: Secondary | ICD-10-CM

## 2012-10-24 NOTE — Progress Notes (Signed)
Exercise Treadmill Test  Pre-Exercise Testing Evaluation Rhythm: normal sinus  Rate: 62     Test  Exercise Tolerance Test Ordering MD: [name], MD  Interpreting MD: Dietrich Pates, MD  Unique Test No:1  Treadmill:  1  Indication for ETT: known ASHD  Contraindication to ETT: No   Stress Modality: exercise - treadmill  Cardiac Imaging Performed: non   Protocol: standard Bruce - maximal  Max BP:  221/71  Max MPHR (bpm):  153 85% MPR (bpm) 153  MPHR obtained (bpm):  139 % MPHR obtained:  91  Reached 85% MPHR (min:sec):   Total Exercise Time (min-sec):  10:02  Workload in METS:  11.8 Borg Scale:   Reason ETT Terminated:  desired heart rate attained    ST Segment Analysis At Rest: normal ST segments - no evidence of significant ST depression With Exercise;  1 mm downsloping ST depression II, IIII in Stage II  By stage III 1 to 2 mm flat ST depression II, III, AVF; 1 to 2 mm Flat  V5/V6;  Increase to V4 in stage IV.  Near normalized by 6 min recovery,  Other Information Arrhythmia:  No Angina during ETT:  absent (0) Quality of ETT:  diagnostic  ETT Interpretation:  Clinically negative, electrically positive for ischemia  Comments:   Recommendations: Will review with M Edwena Felty

## 2012-11-04 ENCOUNTER — Encounter (HOSPITAL_COMMUNITY): Payer: Self-pay | Admitting: Pharmacy Technician

## 2012-11-14 ENCOUNTER — Encounter (HOSPITAL_COMMUNITY): Payer: Self-pay | Admitting: Physician Assistant

## 2012-11-14 ENCOUNTER — Ambulatory Visit (HOSPITAL_COMMUNITY)
Admission: RE | Admit: 2012-11-14 | Discharge: 2012-11-14 | Disposition: A | Discharge status: Home / Self Care | Payer: 59 | Source: Ambulatory Visit | Attending: Cardiovascular Disease | Admitting: Cardiovascular Disease

## 2012-11-14 ENCOUNTER — Encounter (HOSPITAL_COMMUNITY)
Admission: RE | Disposition: A | Discharge status: Home / Self Care | Payer: Self-pay | Source: Ambulatory Visit | Attending: Cardiovascular Disease

## 2012-11-14 DIAGNOSIS — I252 Old myocardial infarction: Secondary | ICD-10-CM | POA: Insufficient documentation

## 2012-11-14 DIAGNOSIS — E785 Hyperlipidemia, unspecified: Secondary | ICD-10-CM | POA: Insufficient documentation

## 2012-11-14 DIAGNOSIS — Z888 Allergy status to other drugs, medicaments and biological substances status: Secondary | ICD-10-CM | POA: Insufficient documentation

## 2012-11-14 DIAGNOSIS — Z79899 Other long term (current) drug therapy: Secondary | ICD-10-CM | POA: Insufficient documentation

## 2012-11-14 DIAGNOSIS — Z7901 Long term (current) use of anticoagulants: Secondary | ICD-10-CM | POA: Insufficient documentation

## 2012-11-14 DIAGNOSIS — I2510 Atherosclerotic heart disease of native coronary artery without angina pectoris: Secondary | ICD-10-CM | POA: Insufficient documentation

## 2012-11-14 DIAGNOSIS — Z8249 Family history of ischemic heart disease and other diseases of the circulatory system: Secondary | ICD-10-CM | POA: Insufficient documentation

## 2012-11-14 DIAGNOSIS — Z7982 Long term (current) use of aspirin: Secondary | ICD-10-CM | POA: Insufficient documentation

## 2012-11-14 DIAGNOSIS — R9439 Abnormal result of other cardiovascular function study: Secondary | ICD-10-CM | POA: Insufficient documentation

## 2012-11-14 DIAGNOSIS — I10 Essential (primary) hypertension: Secondary | ICD-10-CM | POA: Insufficient documentation

## 2012-11-14 DIAGNOSIS — M5090 Cervical disc disorder, unspecified, unspecified cervical region: Secondary | ICD-10-CM | POA: Insufficient documentation

## 2012-11-14 DIAGNOSIS — Z9861 Coronary angioplasty status: Secondary | ICD-10-CM | POA: Insufficient documentation

## 2012-11-14 HISTORY — DX: Essential (primary) hypertension: I10

## 2012-11-14 HISTORY — PX: LEFT HEART CATHETERIZATION WITH CORONARY ANGIOGRAM: SHX5451

## 2012-11-14 LAB — CBC
HCT: 43.4 % (ref 39.0–52.0)
Hemoglobin: 14.9 g/dL (ref 13.0–17.0)
MCH: 31.5 pg (ref 26.0–34.0)
MCHC: 34.3 g/dL (ref 30.0–36.0)
MCV: 91.8 fL (ref 78.0–100.0)

## 2012-11-14 LAB — BASIC METABOLIC PANEL
CO2: 25 mEq/L (ref 19–32)
Calcium: 9.7 mg/dL (ref 8.4–10.5)
Creatinine, Ser: 0.76 mg/dL (ref 0.50–1.35)
Glucose, Bld: 109 mg/dL — ABNORMAL HIGH (ref 70–99)

## 2012-11-14 SURGERY — LEFT HEART CATHETERIZATION WITH CORONARY ANGIOGRAM
Anesthesia: LOCAL

## 2012-11-14 MED ORDER — SODIUM CHLORIDE 0.9 % IV SOLN: 250.0000 mL | INTRAVENOUS | Status: DC | PRN

## 2012-11-14 MED ORDER — SODIUM CHLORIDE 0.9 % IJ SOLN: 3.0000 mL | INTRAMUSCULAR | Status: DC | PRN

## 2012-11-14 MED ORDER — SODIUM CHLORIDE 0.9 % IV SOLN
1.0000 mL/kg/h | INTRAVENOUS | Status: DC
  Administered 2012-11-14: 1 mL/kg/h via INTRAVENOUS

## 2012-11-14 MED ORDER — SODIUM CHLORIDE 0.9 % IJ SOLN
3.0000 mL | Freq: Two times a day (BID) | INTRAMUSCULAR | Status: DC
Start: 2012-11-14 — End: 2012-11-14

## 2012-11-14 MED ORDER — ASPIRIN 81 MG PO CHEW
324.0000 mg | CHEWABLE_TABLET | ORAL | Status: AC
  Administered 2012-11-14: 324 mg via ORAL

## 2012-11-14 MED ORDER — MIDAZOLAM HCL 2 MG/2ML IJ SOLN
INTRAMUSCULAR | Status: AC
  Filled 2012-11-14: qty 2

## 2012-11-14 MED ORDER — SODIUM CHLORIDE 0.9 % IV SOLN
INTRAVENOUS | Status: AC
  Filled 2012-11-14: qty 250

## 2012-11-14 MED ORDER — HEPARIN (PORCINE) IN NACL 2-0.9 UNIT/ML-% IJ SOLN
INTRAMUSCULAR | Status: AC
  Filled 2012-11-14: qty 1000

## 2012-11-14 MED ORDER — ACETAMINOPHEN 325 MG PO TABS: 650.0000 mg | ORAL_TABLET | ORAL | Status: DC | PRN

## 2012-11-14 MED ORDER — SODIUM CHLORIDE 0.9 % IV SOLN: INTRAVENOUS | Status: DC

## 2012-11-14 MED ORDER — LIDOCAINE HCL (PF) 1 % IJ SOLN
INTRAMUSCULAR | Status: AC
  Filled 2012-11-14: qty 30

## 2012-11-14 MED ORDER — NITROGLYCERIN IN D5W 200-5 MCG/ML-% IV SOLN
INTRAVENOUS | Status: AC
  Filled 2012-11-14: qty 250

## 2012-11-14 MED ORDER — ASPIRIN 81 MG PO CHEW
CHEWABLE_TABLET | ORAL | Status: AC
  Filled 2012-11-14: qty 4

## 2012-11-14 MED ORDER — DIAZEPAM 5 MG PO TABS
ORAL_TABLET | ORAL | Status: AC
  Filled 2012-11-14: qty 1

## 2012-11-14 MED ORDER — HEPARIN SODIUM (PORCINE) 1000 UNIT/ML IJ SOLN
INTRAMUSCULAR | Status: AC
  Filled 2012-11-14: qty 1

## 2012-11-14 MED ORDER — SODIUM CHLORIDE 0.9 % IV SOLN
INTRAVENOUS | Status: DC
  Administered 2012-11-14: 06:00:00 via INTRAVENOUS

## 2012-11-14 MED ORDER — ONDANSETRON HCL 4 MG/2ML IJ SOLN: 4.0000 mg | Freq: Four times a day (QID) | INTRAMUSCULAR | Status: DC | PRN

## 2012-11-14 MED ORDER — DIAZEPAM 5 MG PO TABS
5.0000 mg | ORAL_TABLET | ORAL | Status: AC
  Administered 2012-11-14: 5 mg via ORAL

## 2012-11-14 MED ORDER — SODIUM CHLORIDE 0.9 % IJ SOLN: 3.0000 mL | Freq: Two times a day (BID) | INTRAMUSCULAR | Status: DC

## 2012-11-14 MED ORDER — FENTANYL CITRATE 0.05 MG/ML IJ SOLN
INTRAMUSCULAR | Status: AC
  Filled 2012-11-14: qty 2

## 2012-11-14 MED ORDER — VERAPAMIL HCL 2.5 MG/ML IV SOLN
INTRAVENOUS | Status: AC
  Filled 2012-11-14: qty 2

## 2012-11-14 NOTE — CV Procedure (Signed)
   Cardiac Catheterization Procedure Note  Name: Robert Revolorio, MD MRN: [id number] DOB: 08/17/1945  Procedure: Left Heart Cath, Selective Coronary Angiography, LV angiography  Indication: Known CAD, abnormal stress test   Procedural Details: The right wrist was prepped, draped, and anesthetized with 1% lidocaine. Using the modified Seldinger technique, a 5 French sheath was introduced into the right radial artery. 3 mg of verapamil was administered through the sheath, weight-based unfractionated heparin was administered intravenously. Standard Judkins catheters were used for selective coronary angiography and left ventriculography. A JR 5 catheter had to be used to engage the right coronary artery. Catheter exchanges were performed over an exchange length guidewire. There were no immediate procedural complications. A TR band was used for radial hemostasis at the completion of the procedure.  The patient was transferred to the post catheterization recovery area for further monitoring.  Procedural Findings: Hemodynamics: AO 116/46 with a mean of 75 LV 119/13  Coronary angiography: Coronary dominance: right  Left mainstem: The left main is patent. There is no significant obstructive disease. There is mild irregularity noted. There is mild calcification present.  Left anterior descending (LAD): The LAD is patent to the apex. The vessel is diffusely diseased but there are no areas of high-grade stenosis. The proximal LAD is patent with minor irregularity. The mid LAD beyond the first diagonal branch has diffuse 40-50% stenosis. The diagonal branches are patent. Further down in the mid LAD and in the distal LAD there are no significant stenoses.  Left circumflex (LCx): The left circumflex is patent. There is 30-40% ostial stenosis. There is a large first OM with no significant stenosis. The second OM is smaller without significant disease. The continuation of the AV groove circumflex has no  significant disease.  Right coronary artery (RCA): The right coronary artery is patent throughout its course. There is no obstructive disease proximally. There is minor regularity noted. The stented segment in the mid vessel has no significant in-stent restenosis. There is mild restenosis noted but it is clearly not flow obstructive. The distal vessel is widely patent. The PDA branch has mild disease in its proximal aspect of about 40%. The PLA branch is small with no significant disease.  Left ventriculography: Left ventricular systolic function is normal, LVEF is estimated at 55%, there is no significant mitral regurgitation   Final Conclusions:   1. Continued patency of the stented segment in the mid right coronary artery 2. Diffuse nonobstructive coronary artery disease as detailed above 3. Normal left ventricular systolic function  Recommendations: Continued medical therapy. The patient is out beyond 12 months from PCI and he can discontinue effient at low risk of stent thrombosis at this point.  [name] 11/14/2012, 8:38 AM

## 2012-11-14 NOTE — H&P (Signed)
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 He was seen in the office in January 2014 for follow-up and was doing well at that time. Exercise treadmill was recommended for May 2014 when he would be 12 months out from his infarct. This was clinically negative (exercised 10:02 without angina) but was electrically positive for ischemia. Dr. Tenny Craw discussed this with Dr. Excell Seltzer who recommended to proceed with cardiac cath. He walks 2x per week without any cardiac symptoms. No CP, SOB, orthopnea, palpitations, or syncope. He currently feels well without complaint. He is compliant with meds except occasionally forgets to take PM metoprolol.  Past Medical History  Diagnosis Date  . Hyperlipidemia   . Other abnormal glucose     A1c normal  . Elevated blood pressure reading without diagnosis of hypertension 10/20/11    BP 180/96 in ER  with NSTEMI    . CAD (coronary artery disease)      S/P NSTEMI 10/20/11: LHC 10/21/11 at [facility] in [place], [place]:  mLAD 20-30%, minor plaque in dLAD and Dx, CFX with minor plaquing, mRCA 70% and 80% hazy, dRCA 90%, EF 65%;  PCI: Promus DES x 2 (3x38 mm and 3x12 mm) to mid and distal RCA  . Cervical disc disease     C 6 L radiculopathy    Surgical History:  Past Surgical History  Procedure Laterality Date  . Tonsillectomy and adenoidectomy    . Wisdom tooth extraction  1972  . Cardiac catheterization  10/21/11    [place] , [place]  . Colonoscopy      Dr  Arlyce Dice , negative     Home Meds: Prior to Admission medications   Medication Sig Start Date End Date Taking? Authorizing  Provider  aspirin 81 MG tablet Take 81 mg by mouth daily.   Yes Historical Provider, MD  metoprolol tartrate (LOPRESSOR) 25 MG tablet Take 1 tablet (25 mg total) by mouth 2 (two) times daily. 10/25/11  Yes Scott Moishe Spice, PA-C  nitroGLYCERIN (NITROSTAT) 0.4 MG SL tablet Place 1 tablet (0.4 mg total) under the tongue every 5 (five) minutes as needed for chest pain. 12/21/11 12/20/12 Yes [name], MD  prasugrel (EFFIENT) 10 MG TABS Take 1 tablet (10 mg total) by mouth daily. 11/19/11  Yes Roderick Pee, MD  rosuvastatin (CRESTOR) 40 MG tablet Take 1 tablet (40 mg total) by mouth daily. 12/21/11  Yes [name], MD    Allergies:  Allergies  Allergen Reactions  . Eugenol     History   Social History  . Marital Status: Married    Spouse Name: N/A    Number of Children: N/A  . Years of Education: N/A   Occupational History  . Not on file.   Social History Main Topics  . Smoking status: Never Smoker   . Smokeless tobacco: Never Used  . Alcohol Use: Yes     Comment: 4-8 drinks socially on weekends  . Drug Use: 2.00 per week  . Sexually Active: Not on file   Other Topics Concern  . Not on file   Social History Narrative  . No  narrative on file     Family History  Problem Relation Age of Onset  . Diabetes Sister     weight related  . Diabetes Brother     NIDDM  . Heart attack Father 82    2 ppd smoker  . Alcohol abuse Father   . Alcohol abuse Mother   . Dementia Paternal Grandmother     in 45s  . Rheum arthritis Paternal Grandfather     Review of Systems: General: negative for chills, fever, night sweats or weight changes.  Cardiovascular: negative for chest pain, edema, orthopnea, palpitations, paroxysmal nocturnal dyspnea, shortness of breath or dyspnea on exertion Respiratory: negative for cough Urologic: negative for hematuria Abdominal: negative for nausea, vomiting, diarrhea, bright red blood per rectum, melena, or hematemesis Neurologic: negative for  visual changes, syncope, or dizziness All other systems reviewed and are otherwise negative except as noted above.  Labs:   Lab Results  Component Value Date   WBC 6.6 11/14/2012   HGB 14.9 11/14/2012   HCT 43.4 11/14/2012   MCV 91.8 11/14/2012   PLT 196 11/14/2012  Bmet pending. Lab Results  Component Value Date   CHOL 154 06/30/2012   HDL 50.40 06/30/2012   LDLCALC 79 06/30/2012   TRIG 125.0 06/30/2012   Radiology/Studies:  Stress test 10/27/2012:ST Segment Analysis  At Rest: normal ST segments - no evidence of significant ST depression  With Exercise; 1 mm downsloping ST depression II, IIII in Stage II By stage III 1 to 2 mm flat ST depression II, III, AVF; 1 to 2 mm Flat V5/V6; Increase to V4 in stage IV. Near normalized by 6 min recovery,  Other Information  Arrhythmia: No Angina during ETT: absent (0)  Quality of ETT: diagnostic  ETT Interpretation: Clinically negative, electrically positive for ischemia    EKG: NSR 61bpm no acute changes, nonspecific ST-T changes (ST upswooping II, avF, V5-V6)  Physical Exam: Blood pressure 138/71, pulse 66, temperature 97.3 F (36.3 C), temperature source Oral, resp. rate 18, height 5\' 7"  (1.702 m), weight 161 lb (73.029 kg), SpO2 97.00%. General: Well developed, well nourished WM in no acute distress. Head: Normocephalic, atraumatic, sclera non-icteric, no xanthomas, nares are without discharge.  Neck: Negative for carotid bruits. JVD not elevated. Lungs: Clear bilaterally to auscultation without wheezes, rales, or rhonchi. Breathing is unlabored. Heart: RRR with S1 S2. No murmurs, rubs, or gallops appreciated. Abdomen: Soft, non-tender, non-distended with normoactive bowel sounds. No hepatomegaly. No rebound/guarding. No obvious abdominal masses. Msk:  Strength and tone appear normal for age. Extremities: No clubbing or cyanosis. No edema.  Distal pedal pulses are 2+ and equal bilaterally. Neuro: Alert and oriented X 3. No focal deficit. No  facial asymmetry. Moves all extremities spontaneously. Psych:  Responds to questions appropriately with a normal affect.    ASSESSMENT AND PLAN:  1. CAD s/p NSTEMI 2013->DESx2 to RCA with recently abnormal ETT - Dr. Excell Seltzer is recommending cath today. Risks and benefits of cardiac catheterization have been discussed with the patient.  These include bleeding, infection, kidney damage, stroke, heart attack, death.  The patient understands these risks and is willing to proceed. 2. Hyperlipidemia - LDL at goal of 48 with Crestor 40mg . 3. HTN - controlled. Consider changing Lopressor to Toprol XL to help with issue of forgetting second dose. 4. Nightly wine use - counseled patient to consider cutting down esp in light of DAPT as well as AHA recommendations. Has had no gastritis sx.  Signed, Ronie Spies PA-C 11/14/2012, 6:40  AM  Patient seen, examined. Available data reviewed. Agree with findings, assessment, and plan as outlined by Ronie Spies, PA-C. The patient had a moderate risk treadmill study with diffuse ST segment depression extending into recovery. After discussion of options, we have elected to proceed with cardiac catheterization and possible PCI. He has known coronary artery disease as detailed above.  [name], M.D. 11/14/2012 7:47 AM

## 2012-11-23 ENCOUNTER — Encounter: Payer: Self-pay | Admitting: Internal Medicine

## 2012-11-24 ENCOUNTER — Telehealth: Payer: Self-pay | Admitting: Physician Assistant

## 2012-11-24 DIAGNOSIS — I2510 Atherosclerotic heart disease of native coronary artery without angina pectoris: Secondary | ICD-10-CM

## 2012-11-24 MED ORDER — METOPROLOL TARTRATE 25 MG PO TABS: 25.0000 mg | ORAL_TABLET | Freq: Two times a day (BID) | ORAL | Status: DC

## 2012-11-24 NOTE — Telephone Encounter (Signed)
Okey Regal Can you check with [facility] and make sure Dr. Frederik Pear Metoprolol Rx was received? Thanks Tereso Newcomer, PA-C   11/24/2012 10:00 AM

## 2012-11-24 NOTE — Telephone Encounter (Signed)
Called the [facility] and refill for metoprolol has been recv'd by pharmacy.

## 2012-12-25 ENCOUNTER — Telehealth: Payer: Self-pay | Admitting: *Deleted

## 2012-12-25 NOTE — Telephone Encounter (Signed)
Patient requesting paper chart with immunization records due to upcoming travel overseas. Immunization records not scanned into chart, will forward request to Estate agent.

## 2012-12-26 ENCOUNTER — Ambulatory Visit (INDEPENDENT_AMBULATORY_CARE_PROVIDER_SITE_OTHER): Payer: 59

## 2012-12-26 DIAGNOSIS — Z23 Encounter for immunization: Secondary | ICD-10-CM

## 2013-01-11 ENCOUNTER — Other Ambulatory Visit: Payer: Self-pay | Admitting: Internal Medicine

## 2013-01-11 ENCOUNTER — Encounter: Payer: Self-pay | Admitting: Internal Medicine

## 2013-01-12 ENCOUNTER — Ambulatory Visit (INDEPENDENT_AMBULATORY_CARE_PROVIDER_SITE_OTHER): Payer: 59

## 2013-01-12 DIAGNOSIS — Z23 Encounter for immunization: Secondary | ICD-10-CM

## 2013-01-19 ENCOUNTER — Encounter: Payer: Self-pay | Admitting: Internal Medicine

## 2013-01-19 ENCOUNTER — Other Ambulatory Visit: Payer: Self-pay | Admitting: Cardiovascular Disease

## 2013-01-19 ENCOUNTER — Other Ambulatory Visit: Payer: Self-pay | Admitting: *Deleted

## 2013-01-19 ENCOUNTER — Telehealth: Payer: Self-pay | Admitting: *Deleted

## 2013-01-19 MED ORDER — ZOLPIDEM TARTRATE 5 MG PO TABS: 5.0000 mg | ORAL_TABLET | Freq: Every evening | ORAL | Status: DC | PRN

## 2013-01-19 MED ORDER — CIPROFLOXACIN HCL 500 MG PO TABS: 500.0000 mg | ORAL_TABLET | Freq: Two times a day (BID) | ORAL | Status: DC

## 2013-01-19 MED ORDER — CRESTOR 40 MG PO TABS: ORAL_TABLET | ORAL | Status: DC

## 2013-01-19 MED ORDER — NITROSTAT 0.4 MG SL SUBL: SUBLINGUAL_TABLET | SUBLINGUAL | Status: DC

## 2013-01-19 NOTE — Telephone Encounter (Signed)
called in to [facility] crestor 40 mg qd # 90 x 3, NTG 0.4 mg prn # 25 x 3

## 2013-01-19 NOTE — Telephone Encounter (Signed)
Message copied by Tarri Fuller on Mon Jan 19, 2013 12:22 PM ------      Message from: Pecola Lawless      Created: Mon Jan 19, 2013 12:10 PM       Could you please renew Crestor 40 mg one daily, dispense 90 ? Also can the nitroglycerin can be refilled. I am not taking it but my presence apply is pretty " crumbly & dusty "  . [facility]      Thank you for your help. Hopp ------

## 2013-01-26 ENCOUNTER — Encounter: Payer: Self-pay | Admitting: Internal Medicine

## 2013-05-08 ENCOUNTER — Other Ambulatory Visit: Payer: Self-pay | Admitting: Cardiovascular Disease

## 2013-05-11 ENCOUNTER — Other Ambulatory Visit: Payer: Self-pay | Admitting: *Deleted

## 2013-05-11 MED ORDER — CRESTOR 40 MG PO TABS: ORAL_TABLET | ORAL | Status: DC

## 2013-05-18 ENCOUNTER — Telehealth: Payer: Self-pay

## 2013-05-18 NOTE — Telephone Encounter (Signed)
Patient called about refill on crestor, wating at pharmacy

## 2013-05-25 ENCOUNTER — Other Ambulatory Visit: Payer: Self-pay | Admitting: *Deleted

## 2013-05-25 MED ORDER — TRAMADOL HCL 50 MG PO TABS: ORAL_TABLET | ORAL | Status: DC

## 2013-05-25 MED ORDER — CYCLOBENZAPRINE HCL 10 MG PO TABS: ORAL_TABLET | ORAL | Status: DC

## 2013-05-28 ENCOUNTER — Ambulatory Visit (INDEPENDENT_AMBULATORY_CARE_PROVIDER_SITE_OTHER): Payer: 59 | Admitting: Family Medicine

## 2013-05-28 ENCOUNTER — Encounter: Payer: Self-pay | Admitting: Family Medicine

## 2013-05-28 VITALS — BP 130/80 | Temp 97.9°F | Wt 166.0 lb

## 2013-05-28 DIAGNOSIS — M5030 Other cervical disc degeneration, unspecified cervical region: Secondary | ICD-10-CM | POA: Insufficient documentation

## 2013-05-28 DIAGNOSIS — E785 Hyperlipidemia, unspecified: Secondary | ICD-10-CM

## 2013-05-28 MED ORDER — PREDNISONE 20 MG PO TABS: 20.0000 mg | ORAL_TABLET | Freq: Every day | ORAL | Status: DC

## 2013-05-28 NOTE — Progress Notes (Signed)
Pre visit review using our clinic review tool, if applicable. No additional management support is needed unless otherwise documented below in the visit note. 

## 2013-05-28 NOTE — Patient Instructions (Signed)
Soft cervical collar  Tramadol and Flexeril............... one half to one of each each bedtime  X-rays tomorrow  Prednisone 40 mg daily  Consider a 48 hour trial of bedrest?????????????  If you develop a neurologic deficit or your symptoms get worse then I think we need to get an MRI and neurosurgical consult.

## 2013-05-28 NOTE — Progress Notes (Signed)
   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   MRN: [id number]  HPI Gradual progression over past 2 weeks of R lower neck & R trapezius aching discomfort, worse in am after sleeping.Marked R deltoid deep aching pain 12/13 upon awakening. Intermittent pain since 12/13  in these areas & R triceps to elbow, lasting hours & responsive to 2 Aleve.  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 Exacerbation  with blowing nose or coughing . Intermittent numbness &/or burning of whole hand or isolated digits. Symptoms dramatically improved with cervical collar, sleeping upright on couch &  Flexeril 10 mg & Tramadol 50 mg qhs past 2 nights. Slight RUE weakness. No stool or urinary incontinence. Pain much improved today only with 20 mg Prednisone last night & 2 Aleve this am. Residual minor ache R trapezius area aggravated by cough.  PMH left C6 radiculopathy with muscle atrophy from cervical disc disease.  The pain is described as dull constant ache initially was 8 and now is down to a 2. However this morning it was severe. The pain starts in his neck and radiates down to his right elbow.  He's been using the Flexeril tramadol and soft cervical collar at home it's helped somewhat but his pain has not resolved. The pain is increased by coughing. He took 20 mg of prednisone last night and that seem to help.  He problem on the left neck about 8 years ago. He had imaging studies which showed a bulging disc. He was treated symptomatically in the pain resolved however he was left with some residual muscle atrophy in his left hand.  Review of Systems  Intermittent dry asthma cough over several weeks  treated with Singulair, albuterol  & Qvar with response.No fever , chills, dyspnea or wheezing . Small collection purulent sputum plugs today     Objective:   Physical Exam   Well-developed and nourished male  no acute distress vital signs stable he is afebrile there is no palpable tenderness. There is full range of motion the neck but he does have pain in the right side radiating down to the shoulder. Sensation muscle strength reflexes are symmetrical except he does have some hypo-thenar wasting left hand from previous disc problem a couple years ago.        Assessment & Plan:  Right-sided cervical disease C5-C6 distribution,,,,,,,,,,, continue soft cervical collar Flexeril and tramadol. Plain x-rays of the neck tomorrow. Consider physical therapy. An MRI if symptoms get worse or he develops a neurologic deficit also prednisone 40 mg daily for 3 days and then taper

## 2013-05-29 ENCOUNTER — Ambulatory Visit (INDEPENDENT_AMBULATORY_CARE_PROVIDER_SITE_OTHER)
Admission: RE | Admit: 2013-05-29 | Discharge: 2013-05-29 | Disposition: A | Discharge status: Home / Self Care | Payer: 59 | Source: Ambulatory Visit | Attending: Family Medicine | Admitting: Family Medicine

## 2013-05-29 DIAGNOSIS — M5030 Other cervical disc degeneration, unspecified cervical region: Secondary | ICD-10-CM

## 2013-06-01 ENCOUNTER — Other Ambulatory Visit: Payer: Self-pay | Admitting: Family Medicine

## 2013-06-01 DIAGNOSIS — M542 Cervicalgia: Secondary | ICD-10-CM

## 2013-07-13 ENCOUNTER — Other Ambulatory Visit: Payer: Self-pay | Admitting: *Deleted

## 2013-07-13 MED ORDER — TRAMADOL HCL 50 MG PO TABS: ORAL_TABLET | ORAL | Status: DC

## 2013-10-04 ENCOUNTER — Emergency Department (INDEPENDENT_AMBULATORY_CARE_PROVIDER_SITE_OTHER)
Admission: EM | Admit: 2013-10-04 | Discharge: 2013-10-04 | Disposition: A | Discharge status: Home / Self Care | Payer: 59 | Source: Home / Self Care

## 2013-10-04 ENCOUNTER — Encounter (HOSPITAL_COMMUNITY): Payer: Self-pay | Admitting: Emergency Medicine

## 2013-10-04 ENCOUNTER — Emergency Department (INDEPENDENT_AMBULATORY_CARE_PROVIDER_SITE_OTHER): Payer: 59

## 2013-10-04 DIAGNOSIS — W19XXXA Unspecified fall, initial encounter: Secondary | ICD-10-CM

## 2013-10-04 DIAGNOSIS — Y92009 Unspecified place in unspecified non-institutional (private) residence as the place of occurrence of the external cause: Secondary | ICD-10-CM

## 2013-10-04 DIAGNOSIS — M25569 Pain in unspecified knee: Secondary | ICD-10-CM

## 2013-10-04 DIAGNOSIS — R296 Repeated falls: Secondary | ICD-10-CM

## 2013-10-04 DIAGNOSIS — Y93K1 Activity, walking an animal: Secondary | ICD-10-CM

## 2013-10-04 NOTE — Discharge Instructions (Signed)
° ° °  Ice to knee 20 minutes 3x daily for the next 24-48 hours. Limit walking as possible. Please f/u with Ortho if continued pain. Great to meet you.

## 2013-10-04 NOTE — ED Notes (Signed)
Patient c/o fall last night on concrete with main injury to right knee. Patient reports he has pain mostly when ambulating but is able to bear weight. Patient denies neurological or cardiac problems. Patient is alert and oriented and in no acute distress.

## 2013-10-04 NOTE — ED Provider Notes (Signed)
CSN: [id number]     Arrival date & time 10/04/13  1637 History   None    Chief Complaint  Patient presents with  . 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 Fell last night while walking dog. Landed on right knee. No immediate pain or swelling. Today pain with ambulating along the inside of his knee. No swelling or bruising. No locking or giving way is noted. No prior history.   The history is provided by the patient.    Past Medical History  Diagnosis Date  . Hyperlipidemia   . Other abnormal glucose     A1c normal  . HTN (hypertension)   . CAD (coronary artery disease)      S/P NSTEMI 10/20/11: LHC 10/21/11 at [facility] in [place], [place]:  mLAD 20-30%, minor plaque in dLAD and Dx, CFX with minor plaquing, mRCA 70% and 80% hazy, dRCA 90%, EF 65%;  PCI: Promus DES x 2 (3x38 mm and 3x12 mm) to mid and distal RCA  . Cervical disc disease     C 6 L radiculopathy on L   Past Surgical History  Procedure Laterality Date  . Tonsillectomy and adenoidectomy    . Wisdom tooth extraction  1972  . Cardiac catheterization  10/21/11    [place] , [place]; 2 stents RCA  . Colonoscopy      Dr  Deatra Ina , negative  . Cardiac catheterization  10/2012    Dr Burt Knack, negative  . Cataract extraction  11/2012    Dr Bing Plume, OD   Family History  Problem Relation Age of Onset  . Diabetes Sister     weight related  . Diabetes Brother     NIDDM  . Heart attack Father 2    2 ppd smoker, died at 22 of MI  . Alcohol abuse Father   . Alcohol abuse Mother   . Dementia Paternal Grandmother     in 40s  . Rheum arthritis Paternal Grandfather    History  Substance Use Topics  . Smoking status: Never Smoker   . Smokeless tobacco: Never Used  . Alcohol Use: Yes     Comment: 2-4 glasses of wine or beer nightly    Review of Systems  All other systems reviewed and are  negative.   Allergies  Eugenol  Home Medications   Prior to Admission medications   Medication Sig Start Date End Date Taking? Authorizing Provider  aspirin 81 MG tablet Take 81 mg by mouth daily.    Historical Provider, MD  CRESTOR 40 MG tablet TAKE 1 TABLET BY MOUTH DAILY. 05/11/13   Sherren Mocha, MD  cyclobenzaprine (FLEXERIL) 10 MG tablet Half to one tab at bedtime as needed for pain 05/25/13   Dorena Cookey, MD  metoprolol tartrate (LOPRESSOR) 25 MG tablet Take 1 tablet (25 mg total) by mouth 2 (two) times daily. 11/24/12   Scott T Weaver, PA-C  NITROSTAT 0.4 MG SL tablet PLACE 1 TABLET UNDER THE TONGUE EVERY 5 MINUTES AS NEEDED FOR CHEST PAIN. 01/19/13   Liliane Shi, PA-C  predniSONE (DELTASONE) 20 MG tablet Take 1 tablet (20 mg total) by mouth daily with breakfast. 05/28/13   Dorena Cookey, MD  traMADol Veatrice Bourbon) 50 MG tablet Half to one tab twice daily as needed for pain 07/13/13   Dorena Cookey, MD   BP 147/76  Pulse 66  Temp(Src) 99.5 F (37.5 C) (Oral)  Resp 18  SpO2 94% Physical Exam  Nursing note and vitals reviewed. Constitutional: He is oriented to person, place, and time. He appears well-developed and well-nourished. No distress.  HENT:  Head: Normocephalic and atraumatic.  Musculoskeletal: He exhibits tenderness. He exhibits no edema.  Full ROM, negative patellar apprehension, pain along medial aspect of right knee with palpation. No ecchymosis. Neg Anterior drawer and Lachman.  Neurological: He is alert and oriented to person, place, and time.  Skin: Skin is warm and dry. He is not diaphoretic.  Psychiatric: His behavior is normal.    ED Course  Procedures (including critical care time) Labs Review Labs Reviewed - No data to display  Imaging Review Dg Knee Complete 4 Views Right  10/04/2013   CLINICAL DATA:  Fall, right knee pain/bruising  EXAM: RIGHT KNEE - COMPLETE 4+ VIEW  COMPARISON:  None.  FINDINGS: No fracture or dislocation is seen.  Joint spaces  are essentially preserved.  The visualized soft tissues are unremarkable.  No suprapatellar knee joint effusion  IMPRESSION: No fracture or dislocation is seen.   Electronically Signed   By: Julian Hy M.D.   On: 10/04/2013 17:46     MDM   1. Knee pain   2. Fall at home    Probable contusion of patella. No fracture or mechanical findings to suggest an underling derangement. F/U with Dr. Tamala Julian at [place] if worsens.     Bjorn Pippin, PA-C 10/04/13 1807

## 2013-10-07 NOTE — ED Provider Notes (Signed)
Medical screening examination/treatment/procedure(s) were performed by a resident physician or non-physician practitioner and as the supervising physician I was immediately available for consultation/collaboration.  Lynne Leader, MD    Gregor Hams, MD 10/07/13 [phone]

## 2013-11-11 ENCOUNTER — Encounter: Payer: Self-pay | Admitting: Internal Medicine

## 2013-11-18 ENCOUNTER — Encounter: Payer: Self-pay | Admitting: Gastroenterology

## 2014-01-15 ENCOUNTER — Other Ambulatory Visit: Payer: Self-pay | Admitting: Physician Assistant

## 2014-01-22 ENCOUNTER — Encounter: Payer: Self-pay | Admitting: Internal Medicine

## 2014-02-10 ENCOUNTER — Encounter: Payer: Self-pay | Admitting: Internal Medicine

## 2014-03-02 ENCOUNTER — Other Ambulatory Visit: Payer: Self-pay | Admitting: Physician Assistant

## 2014-03-02 DIAGNOSIS — E785 Hyperlipidemia, unspecified: Secondary | ICD-10-CM

## 2014-03-02 MED ORDER — CRESTOR 40 MG PO TABS: ORAL_TABLET | ORAL | Status: DC

## 2014-04-02 ENCOUNTER — Ambulatory Visit (INDEPENDENT_AMBULATORY_CARE_PROVIDER_SITE_OTHER): Payer: 59 | Admitting: Physician Assistant

## 2014-04-02 ENCOUNTER — Encounter: Payer: Self-pay | Admitting: Physician Assistant

## 2014-04-02 VITALS — BP 140/70 | HR 77 | Ht 67.0 in | Wt 165.0 lb

## 2014-04-02 DIAGNOSIS — E785 Hyperlipidemia, unspecified: Secondary | ICD-10-CM

## 2014-04-02 DIAGNOSIS — I10 Essential (primary) hypertension: Secondary | ICD-10-CM | POA: Insufficient documentation

## 2014-04-02 DIAGNOSIS — I2510 Atherosclerotic heart disease of native coronary artery without angina pectoris: Secondary | ICD-10-CM

## 2014-04-02 MED ORDER — METOPROLOL TARTRATE 25 MG PO TABS: 25.0000 mg | ORAL_TABLET | Freq: Two times a day (BID) | ORAL | Status: DC

## 2014-04-02 NOTE — Patient Instructions (Signed)
Your physician wants you to follow-up in: 12 months with Dr. Cooper. You will receive a reminder letter in the mail two months in advance. If you don't receive a letter, please call our office to schedule the follow-up appointment.  

## 2014-04-02 NOTE — Progress Notes (Signed)
Cardiology Office Note   Date:  04/02/2014   ID:  Robert Cobble, MD, DOB Sep 21, 1945, MRN [id number]  PCP:  Joycelyn Man, MD  Cardiologist:  Dr. 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  He had a + treadmill test in 10/2012.  LHC demonstrated patent stents and mild to moderate nonobstructive disease elsewhere.  He returns for FU.  He is struggling with R UE radicular symptoms from cervical DDD.  Otherwise, he denies chest discomfort, dyspnea, syncope, edema.  He is going to try to walk for exercise more.     Studies:  - LHC (6/14):  Mid LAD 40-50, ostial circumflex 30-40, mid and distal RCA stents patent, proximal PDA 40, EF 55%   Recent Labs/Images: 09/03/13:  K 4.8, Creatinine 0.76, ALT 25, TSH 1.168, Hgb 14.4, LDL 69   Wt Readings from Last 3 Encounters:  05/28/13 166 lb (75.297 kg)  11/14/12 161 lb (73.029 kg)  11/14/12 161 lb (73.029 kg)     Past Medical History  Diagnosis Date  . Hyperlipidemia   . Other abnormal glucose     A1c normal  . HTN (hypertension)   . CAD (coronary artery disease)     a. S/P NSTEMI 10/20/11: LHC at [facility] in [place], [place]:  mRCA 70% and 80% hazy, dRCA 90%, EF 65%; >>  PCI: Promus DES x 2 (3x38 mm and 3x12 mm) to mid and distal RCA;  b.  LHC (6/14):  Mid LAD 40-50, ostial circumflex 30-40, mid and distal RCA stents patent, proximal PDA 40, EF 55%  . Cervical disc disease     C 6 L radiculopathy on L    Current Outpatient Prescriptions  Medication Sig Dispense Refill  . aspirin 81 MG tablet Take 81 mg by mouth daily.      . CRESTOR 40 MG tablet TAKE 1 TABLET BY MOUTH DAILY.  90 tablet  1  . cyclobenzaprine (FLEXERIL) 10 MG tablet Half to one tab at bedtime as needed for pain  40 tablet  2  . metoprolol tartrate (LOPRESSOR) 25 MG tablet TAKE 1 TABLET BY MOUTH 2 TIMES DAILY.   180 tablet  0  . NITROSTAT 0.4 MG SL tablet PLACE 1 TABLET UNDER THE TONGUE EVERY 5 MINUTES AS NEEDED FOR CHEST PAIN.  25 tablet  3  . predniSONE (DELTASONE) 20 MG tablet Take 1 tablet (20 mg total) by mouth daily with breakfast.  40 tablet  1  . traMADol (ULTRAM) 50 MG tablet Half to one tab twice daily as needed for pain  100 tablet  1   No current facility-administered medications for this visit.     Allergies:   Eugenol   Social History:  The patient  reports that he has never smoked. He has never used smokeless tobacco. He reports that he drinks alcohol. He reports that he does not use illicit drugs.   Family History:  The patient's family history includes Alcohol abuse in his father and mother; Dementia in his paternal grandmother; Diabetes in his brother and sister; Heart attack (age of onset: 84) in his father; Rheum arthritis in his paternal grandfather.   ROS:  Please see the history of present illness.      All other systems reviewed and negative.    PHYSICAL  EXAM: VS:  BP 140/70  Pulse 77  Ht 5\' 7"  (1.702 m)  Wt 165 lb (74.844 kg)  BMI 25.84 kg/m2 Well nourished, well developed, in no acute distress HEENT: normal Neck: no JVD Vascular:  No carotid bruits Cardiac:  normal S1, S2; RRR; no murmur Lungs:  clear to auscultation bilaterally, no wheezing, rhonchi or rales Abd: soft, nontender, no hepatomegaly Ext: no edema Skin: warm and dry Neuro:  CNs 2-12 intact, no focal abnormalities noted  EKG:  NSR, HR 77, normal axis, NSSTTW changes      ASSESSMENT AND PLAN:  Coronary artery disease  -  Doing well.  No angina.  LHC last year with patent stents and non-obs disease elsewhere.  He remains on ASA, beta blocker, statin.  Hyperlipidemia  -  LDL optimal.  Continue statin.  Essential hypertension -  Borderline control.  He forgets his PM Metoprolol sometimes.  We discussed changing to Toprol-XL but her prefers to remain on Metoprolol Tartrate.  Continue to monitor.     Disposition:   FU with Dr. Sherren Mocha 1 year.    Signed, Versie Starks, MHS 04/02/2014 8:10 AM    [facility] [address], [place], [place]  [address] Phone: [phone]; Fax: [phone]

## 2014-04-16 ENCOUNTER — Ambulatory Visit: Payer: 59 | Admitting: Physician Assistant

## 2014-04-28 ENCOUNTER — Other Ambulatory Visit: Payer: Self-pay | Admitting: *Deleted

## 2014-04-28 MED ORDER — HYDROCODONE-HOMATROPINE 5-1.5 MG/5ML PO SYRP: 5.0000 mL | ORAL_SOLUTION | Freq: Three times a day (TID) | ORAL | Status: DC | PRN

## 2014-04-28 MED ORDER — PREDNISONE 20 MG PO TABS: 20.0000 mg | ORAL_TABLET | Freq: Every day | ORAL | Status: DC

## 2014-04-28 NOTE — Telephone Encounter (Signed)
Rx for prednisone and Hydromet per Dr Sherren Mocha

## 2014-04-29 ENCOUNTER — Ambulatory Visit (INDEPENDENT_AMBULATORY_CARE_PROVIDER_SITE_OTHER): Payer: 59 | Admitting: Family

## 2014-04-29 VITALS — BP 160/80 | HR 88 | Temp 98.2°F | Resp 20 | Ht 66.73 in | Wt 163.0 lb

## 2014-04-29 DIAGNOSIS — J209 Acute bronchitis, unspecified: Secondary | ICD-10-CM

## 2014-04-29 MED ORDER — AZITHROMYCIN 250 MG PO TABS: ORAL_TABLET | ORAL | Status: DC

## 2014-04-29 NOTE — Progress Notes (Signed)
   Subjective:    Patient ID: Robert Cobble, Robert Owen, male    DOB: [birth year], 68 y.o.   MRN: [id number]  Chief Complaint  Patient presents with  . Cough    Cough and congestion for 5 days    HPI:   Robert Cobble, Robert Owen is a 68 y.o. male who presents today for a cough.  Acute symptoms started about 5 days ago with a scratchy throat and dry cough. Initially had some improvement, but has since worsened to include occasional wheezing without shortness of breath. Occasional purulent mucus plugs. Has tried OTC medications which have provided little relief.   Allergies  Allergen Reactions  . Eugenol     Topical dental anesthetic caused migraine type headache   Current Outpatient Prescriptions on File Prior to Visit  Medication Sig Dispense Refill  . aspirin 81 MG tablet Take 81 mg by mouth daily.    . CRESTOR 40 MG tablet TAKE 1 TABLET BY MOUTH DAILY. 90 tablet 1  . HYDROcodone-homatropine (HYCODAN) 5-1.5 MG/5ML syrup Take 5 mLs by mouth every 8 (eight) hours as needed. 240 mL 0  . metoprolol tartrate (LOPRESSOR) 25 MG tablet Take 1 tablet (25 mg total) by mouth 2 (two) times daily. 180 tablet 3  . NITROSTAT 0.4 MG SL tablet PLACE 1 TABLET UNDER THE TONGUE EVERY 5 MINUTES AS NEEDED FOR CHEST PAIN. 25 tablet 3  . predniSONE (DELTASONE) 20 MG tablet Take 1 tablet (20 mg total) by mouth daily. 50 tablet 1   No current facility-administered medications on file prior to visit.   Review of Systems    See HPI  Objective:    BP 160/80 mmHg  Pulse 88  Temp(Src) 98.2 F (36.8 C)  Resp 20  Ht 5' 6.73" (1.695 m)  Wt 163 lb (73.936 kg)  BMI 25.73 kg/m2  SpO2 97% Nursing note and vital signs reviewed.  Physical Exam  Constitutional: He is oriented to person, place, and time. He appears well-developed and well-nourished. No distress.  HENT:  Right Ear: Hearing, tympanic membrane, external ear and ear canal normal.  Left Ear: Hearing, tympanic membrane, external ear and ear canal normal.    Mouth/Throat: Uvula is midline and mucous membranes are normal. Posterior oropharyngeal erythema present.  Increased cerumen noted in right ear.   Cardiovascular: Normal rate, regular rhythm, normal heart sounds and intact distal pulses.   Pulmonary/Chest: Effort normal and breath sounds normal.  Lymphadenopathy:    He has cervical adenopathy.  Neurological: He is alert and oriented to person, place, and time.  Skin: Skin is warm and dry.  Psychiatric: He has a normal mood and affect. His behavior is normal. Judgment and thought content normal.       Assessment & Plan:

## 2014-04-29 NOTE — Assessment & Plan Note (Signed)
Symptoms and exam consistent with acute bacterial bronchitis. Start Azithromycin. Pt indicates he has enough steroid if needed and cough syrup as needed. Continue OTC medications as needed for symptom relief. Avoid -D medications with high blood pressure. Follow up if symptoms worsen or fail to improve.

## 2014-05-10 ENCOUNTER — Other Ambulatory Visit: Payer: Self-pay | Admitting: Family

## 2014-05-10 DIAGNOSIS — R059 Cough, unspecified: Secondary | ICD-10-CM

## 2014-05-10 DIAGNOSIS — R05 Cough: Secondary | ICD-10-CM

## 2014-05-11 ENCOUNTER — Ambulatory Visit (INDEPENDENT_AMBULATORY_CARE_PROVIDER_SITE_OTHER)
Admission: RE | Admit: 2014-05-11 | Discharge: 2014-05-11 | Disposition: A | Discharge status: Home / Self Care | Payer: 59 | Source: Ambulatory Visit | Attending: Family | Admitting: Family

## 2014-05-11 DIAGNOSIS — R059 Cough, unspecified: Secondary | ICD-10-CM

## 2014-05-11 DIAGNOSIS — R05 Cough: Secondary | ICD-10-CM

## 2014-05-20 ENCOUNTER — Encounter (HOSPITAL_COMMUNITY): Payer: Self-pay | Admitting: Cardiovascular Disease

## 2014-06-05 IMAGING — CR DG KNEE COMPLETE 4+V*R*
4 series · 4 of 4 positions shown · non-contrast
Comparison: None.

CLINICAL DATA: Fall, right knee pain/bruising

EXAM:
RIGHT KNEE - COMPLETE 4+ VIEW

[view not recorded (1 of 4)]
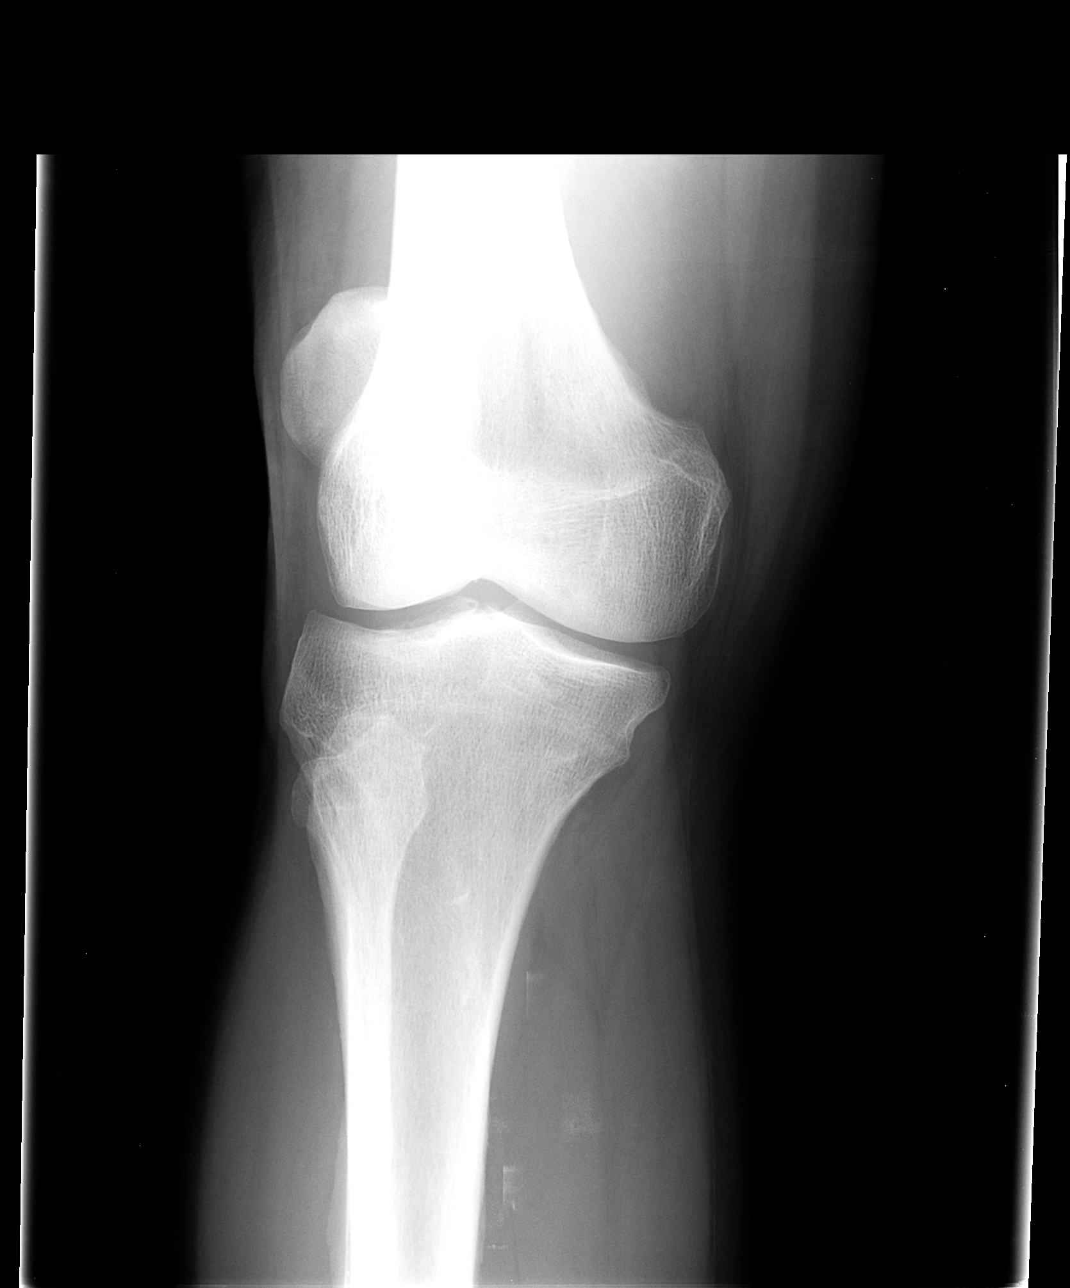

[view not recorded (2 of 4)]
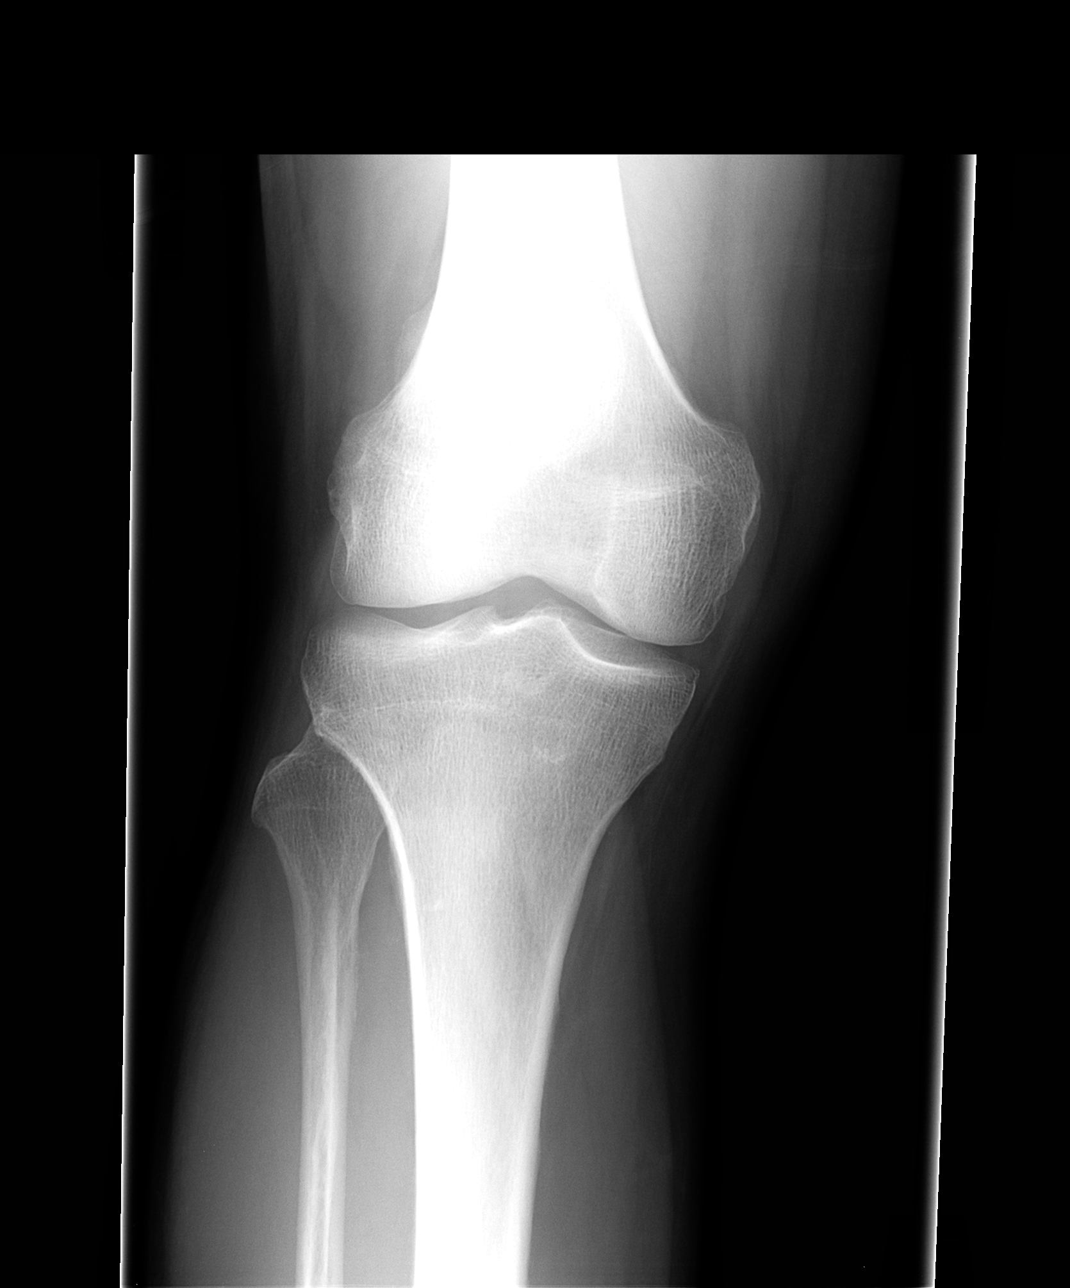

[view not recorded (3 of 4)]
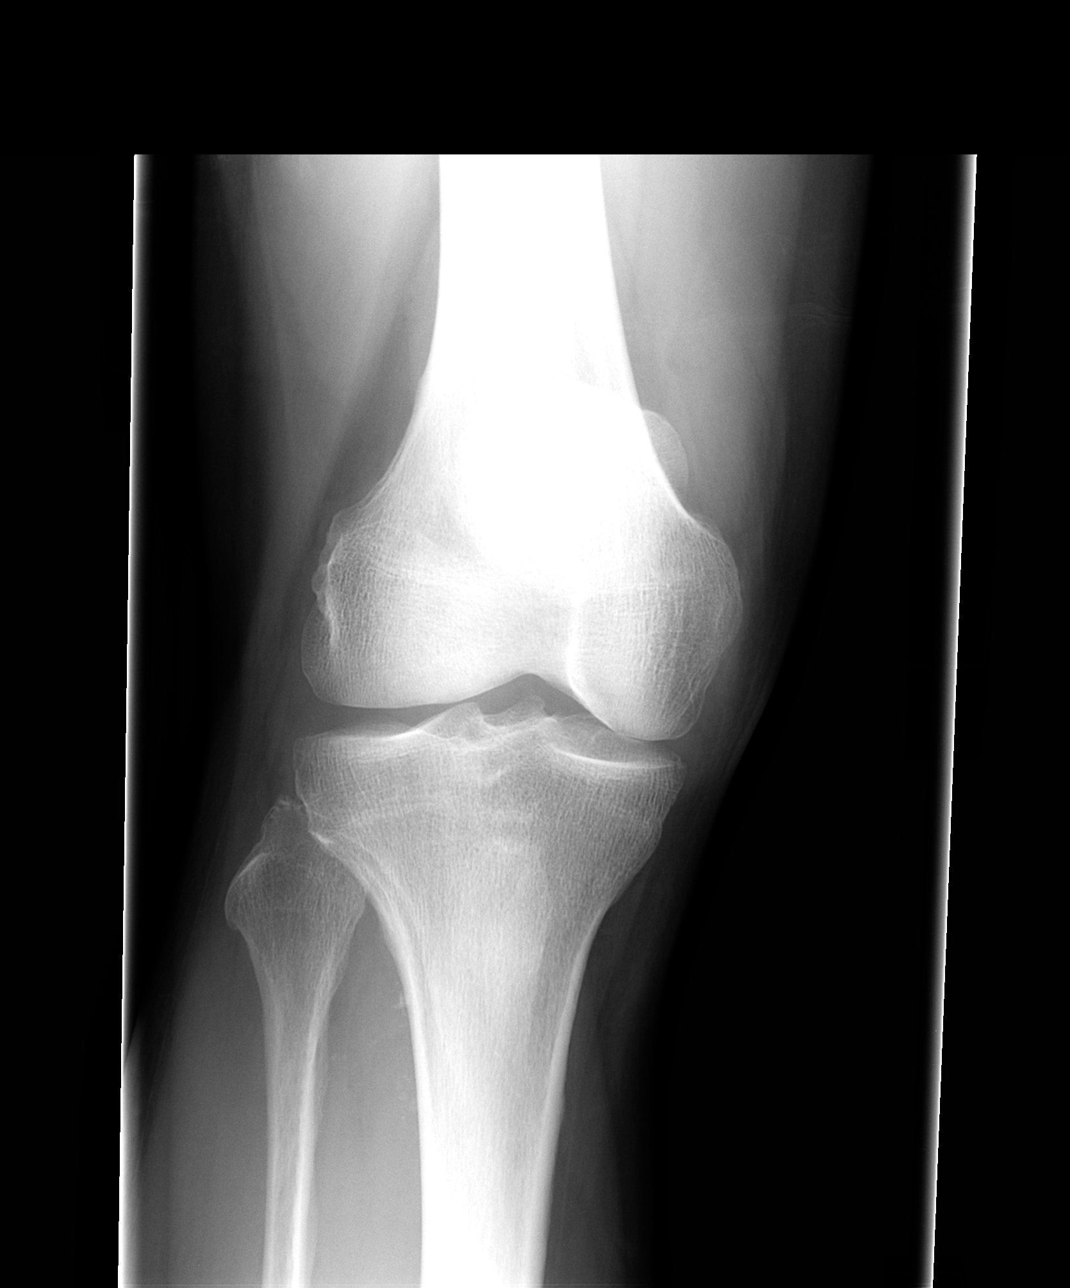

[view not recorded (4 of 4)]
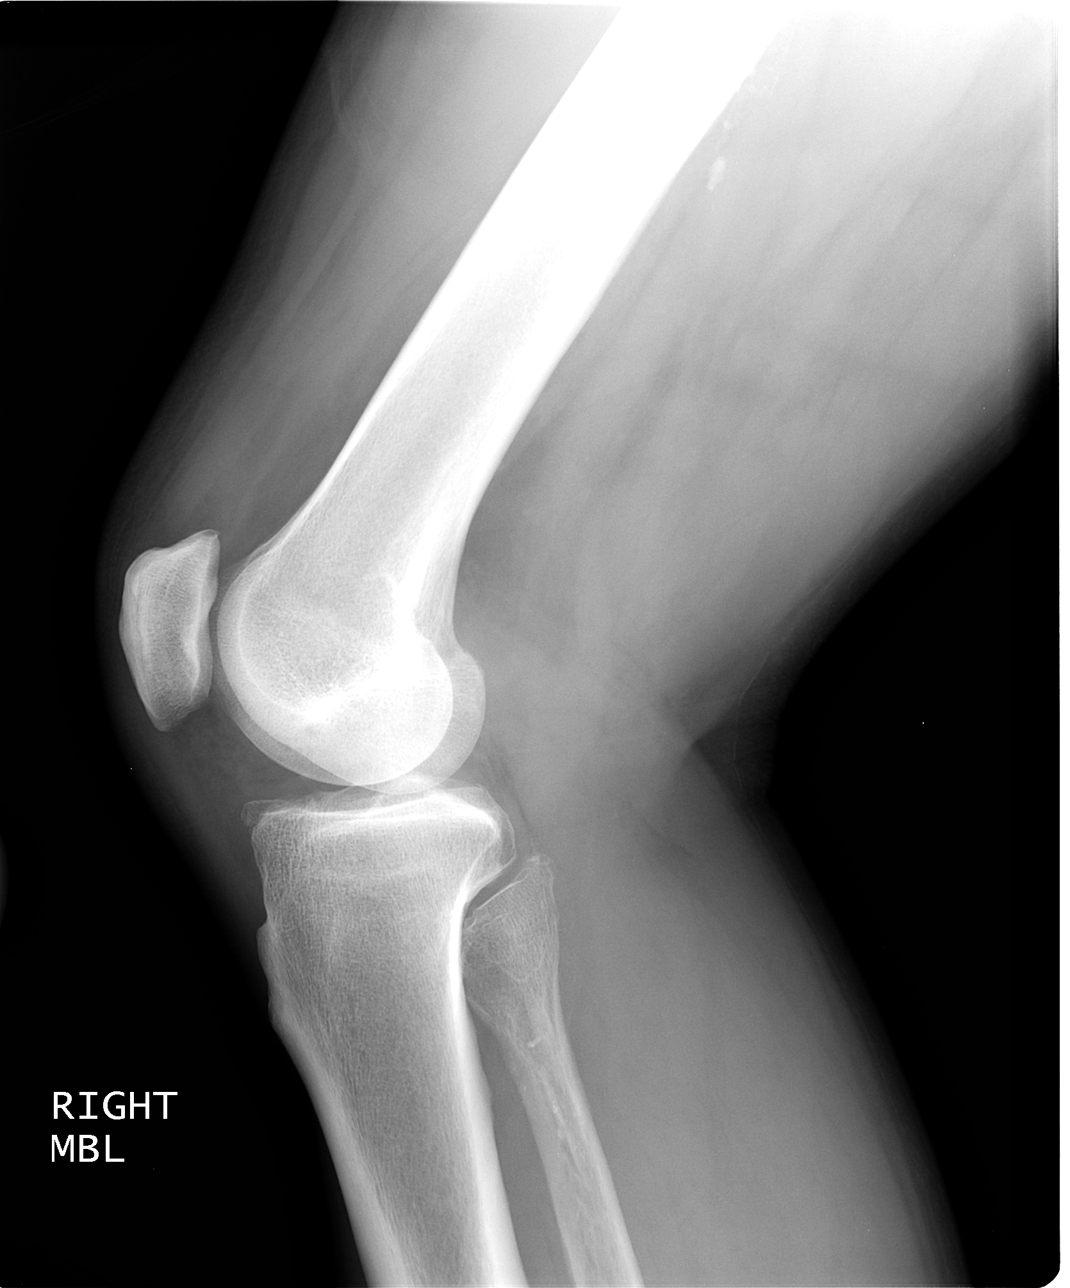

[4 of 4 positions shown; findings below may reference images not displayed]

FINDINGS: No fracture or dislocation is seen.

Joint spaces are essentially preserved.

The visualized soft tissues are unremarkable.

No suprapatellar knee joint effusion
IMPRESSION: No fracture or dislocation is seen.

## 2014-08-30 ENCOUNTER — Other Ambulatory Visit: Payer: Self-pay | Admitting: Physician Assistant

## 2014-10-11 ENCOUNTER — Ambulatory Visit (INDEPENDENT_AMBULATORY_CARE_PROVIDER_SITE_OTHER): Payer: 59 | Admitting: Family

## 2014-10-11 ENCOUNTER — Encounter: Payer: Self-pay | Admitting: Physician Assistant

## 2014-10-11 VITALS — BP 142/70 | HR 72 | Temp 98.3°F | Resp 18

## 2014-10-11 DIAGNOSIS — J069 Acute upper respiratory infection, unspecified: Secondary | ICD-10-CM

## 2014-10-11 MED ORDER — AZITHROMYCIN 250 MG PO TABS: ORAL_TABLET | ORAL | Status: DC

## 2014-10-11 NOTE — Assessment & Plan Note (Signed)
Symptoms and exam consistent with acute upper respiratory infection most likely bacterial given length of time and symptoms. Complete azithromycin dose. Continue over-the-counter medications as needed for symptom relief and supportive care. Follow up if symptoms worsen or fail to improve.

## 2014-10-11 NOTE — Patient Instructions (Signed)
Thank you for choosing [facility].  Summary/Instructions:  Your prescription(s) have been submitted to your pharmacy or been printed and provided for you. Please take as directed and contact our office if you believe you are having problem(s) with the medication(s) or have any questions.  If your symptoms worsen or fail to improve, please contact our office for further instruction, or in case of emergency go directly to the emergency room at the closest medical facility.     

## 2014-10-11 NOTE — Progress Notes (Signed)
   Subjective:    Patient ID: Robert Owen, male    DOB: Apr 09, 1946, 69 y.o.   MRN: [id number]  Chief Complaint  Patient presents with  . Cough    HPI:  Robert Owen is a 69 y.o. male with a PMH of hyperlipidemia, hypertension, CAD and asthma who presents today for an acute office visit.   This is a new problem. Associated symptoms of scratchy throat, intermittent nonproductive cough, diffuse arthralgias, pearly sputum, and occasional mucous plugs has been going on for approximately one week. Modifying factors include Mucinex, and Tylenol which have provided minimal relief. He notes he took 2 leftover azithromycin last night.  Allergies  Allergen Reactions  . Eugenol     Topical dental anesthetic caused migraine type headache    Current Outpatient Prescriptions on File Prior to Visit  Medication Sig Dispense Refill  . aspirin 81 MG tablet Take 81 mg by mouth daily.    . CRESTOR 40 MG tablet TAKE 1 TABLET BY MOUTH DAILY. 90 tablet 1  . HYDROcodone-homatropine (HYCODAN) 5-1.5 MG/5ML syrup Take 5 mLs by mouth every 8 (eight) hours as needed. 240 mL 0  . metoprolol tartrate (LOPRESSOR) 25 MG tablet Take 1 tablet (25 mg total) by mouth 2 (two) times daily. 180 tablet 3  . NITROSTAT 0.4 MG SL tablet PLACE 1 TABLET UNDER THE TONGUE EVERY 5 MINUTES AS NEEDED FOR CHEST PAIN. 25 tablet 3  . predniSONE (DELTASONE) 20 MG tablet Take 1 tablet (20 mg total) by mouth daily. 50 tablet 1   No current facility-administered medications on file prior to visit.    Review of Systems  Constitutional: Positive for chills.  HENT: Positive for congestion and sore throat. Negative for facial swelling and sinus pressure.   Respiratory: Negative for chest tightness, shortness of breath and wheezing.   Cardiovascular: Negative for chest pain.      Objective:    BP 142/70 mmHg  Pulse 72  Temp(Src) 98.3 F (36.8 C)  Resp 18  SpO2 96%  Nursing note and vital signs reviewed.  Physical Exam    Constitutional: He is oriented to person, place, and time. He appears well-developed and well-nourished. No distress.  HENT:  Right Ear: Hearing, external ear and ear canal normal.  Left Ear: Hearing, tympanic membrane, external ear and ear canal normal.  Nose: Nose normal. Right sinus exhibits no maxillary sinus tenderness and no frontal sinus tenderness. Left sinus exhibits no maxillary sinus tenderness and no frontal sinus tenderness.  Mouth/Throat: Uvula is midline, oropharynx is clear and moist and mucous membranes are normal.  Impacted cerumen noted in right ear.  Neck: Neck supple.  Cardiovascular: Normal rate, regular rhythm, normal heart sounds and intact distal pulses.   Pulmonary/Chest: Effort normal and breath sounds normal.  Lymphadenopathy:    He has no cervical adenopathy.  Neurological: He is alert and oriented to person, place, and time.  Skin: Skin is warm and dry.  Psychiatric: He has a normal mood and affect. His behavior is normal. Judgment and thought content normal.       Assessment & Plan:

## 2014-10-12 ENCOUNTER — Other Ambulatory Visit: Payer: Self-pay | Admitting: Family

## 2014-10-12 MED ORDER — BUDESONIDE-FORMOTEROL FUMARATE 160-4.5 MCG/ACT IN AERO: 2.0000 | INHALATION_SPRAY | Freq: Two times a day (BID) | RESPIRATORY_TRACT | Status: DC

## 2014-10-12 MED ORDER — HYDROCODONE-HOMATROPINE 5-1.5 MG/5ML PO SYRP: 5.0000 mL | ORAL_SOLUTION | Freq: Three times a day (TID) | ORAL | Status: DC | PRN

## 2015-01-10 IMAGING — CR DG CHEST 2V
2 series · 2 of 2 positions shown · non-contrast
Comparison: 02/22/2010

CLINICAL DATA: Cough and acute pleuritic left chest pain, history
of a asthma

EXAM:
CHEST - 2 VIEW

[view not recorded (1 of 2)]
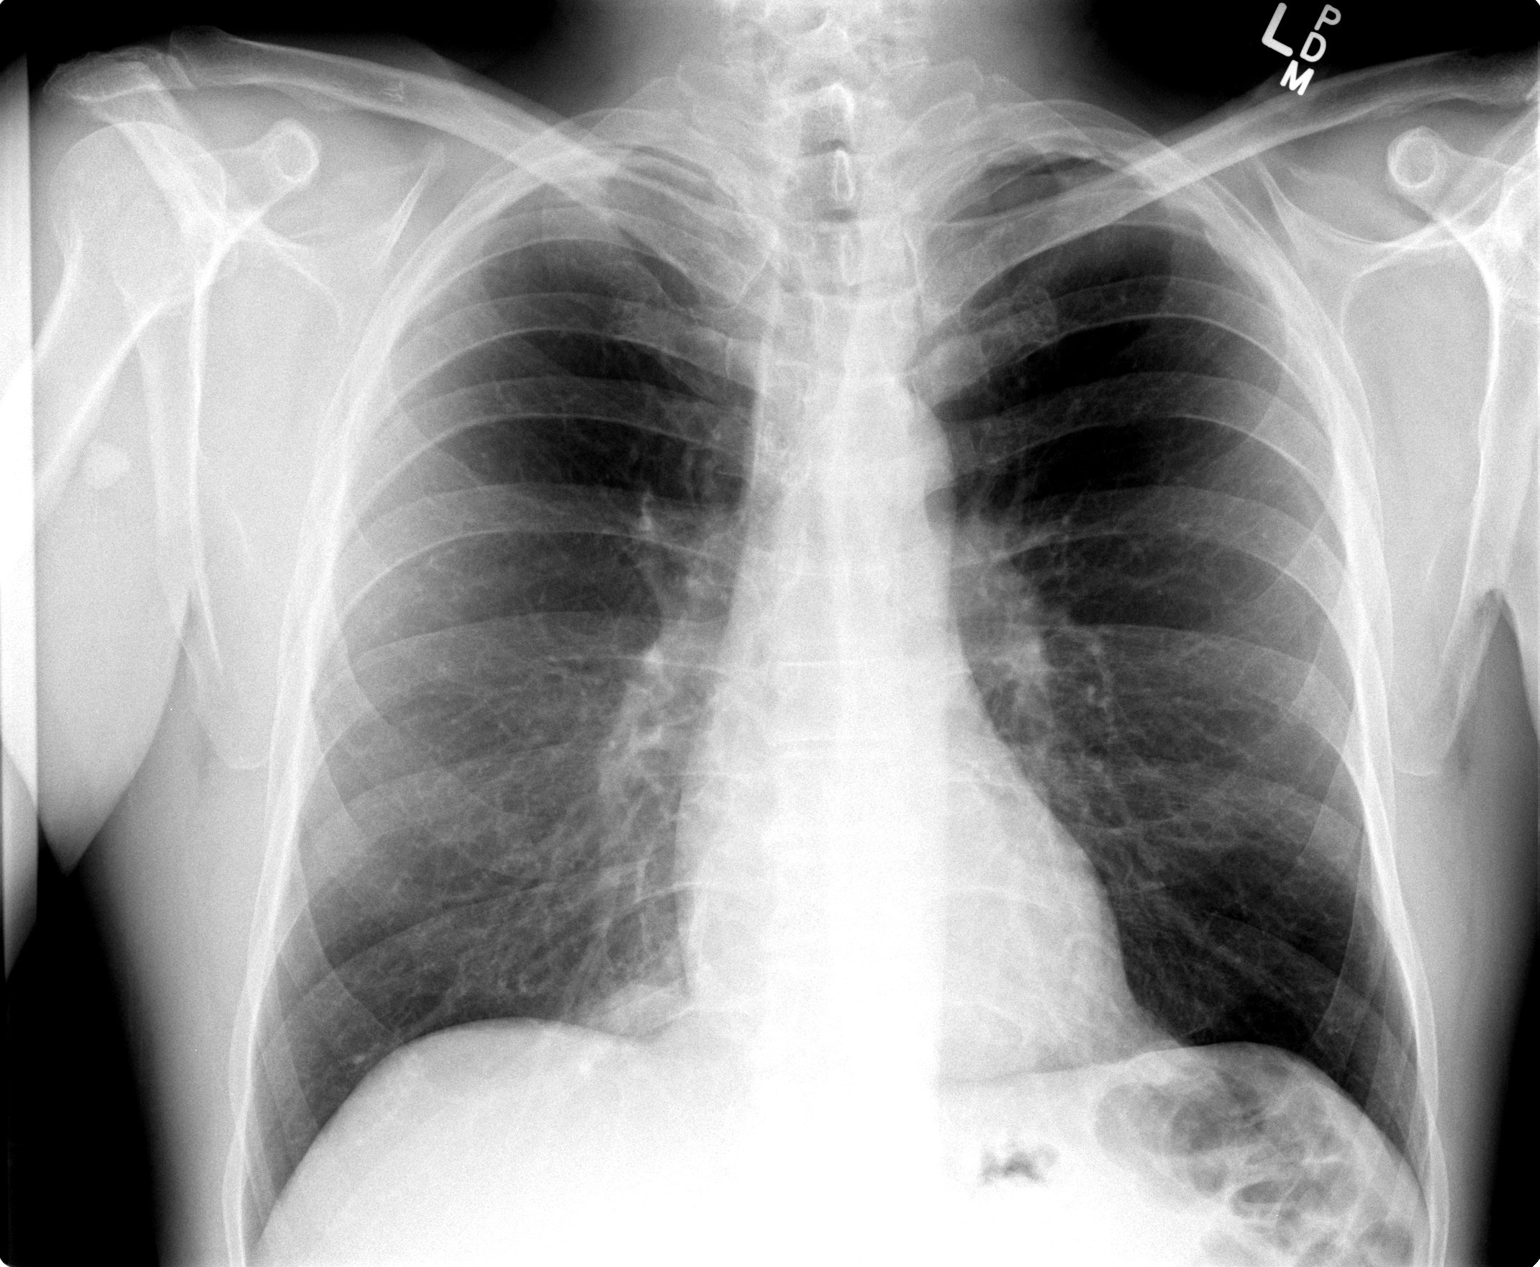

[view not recorded (2 of 2)]
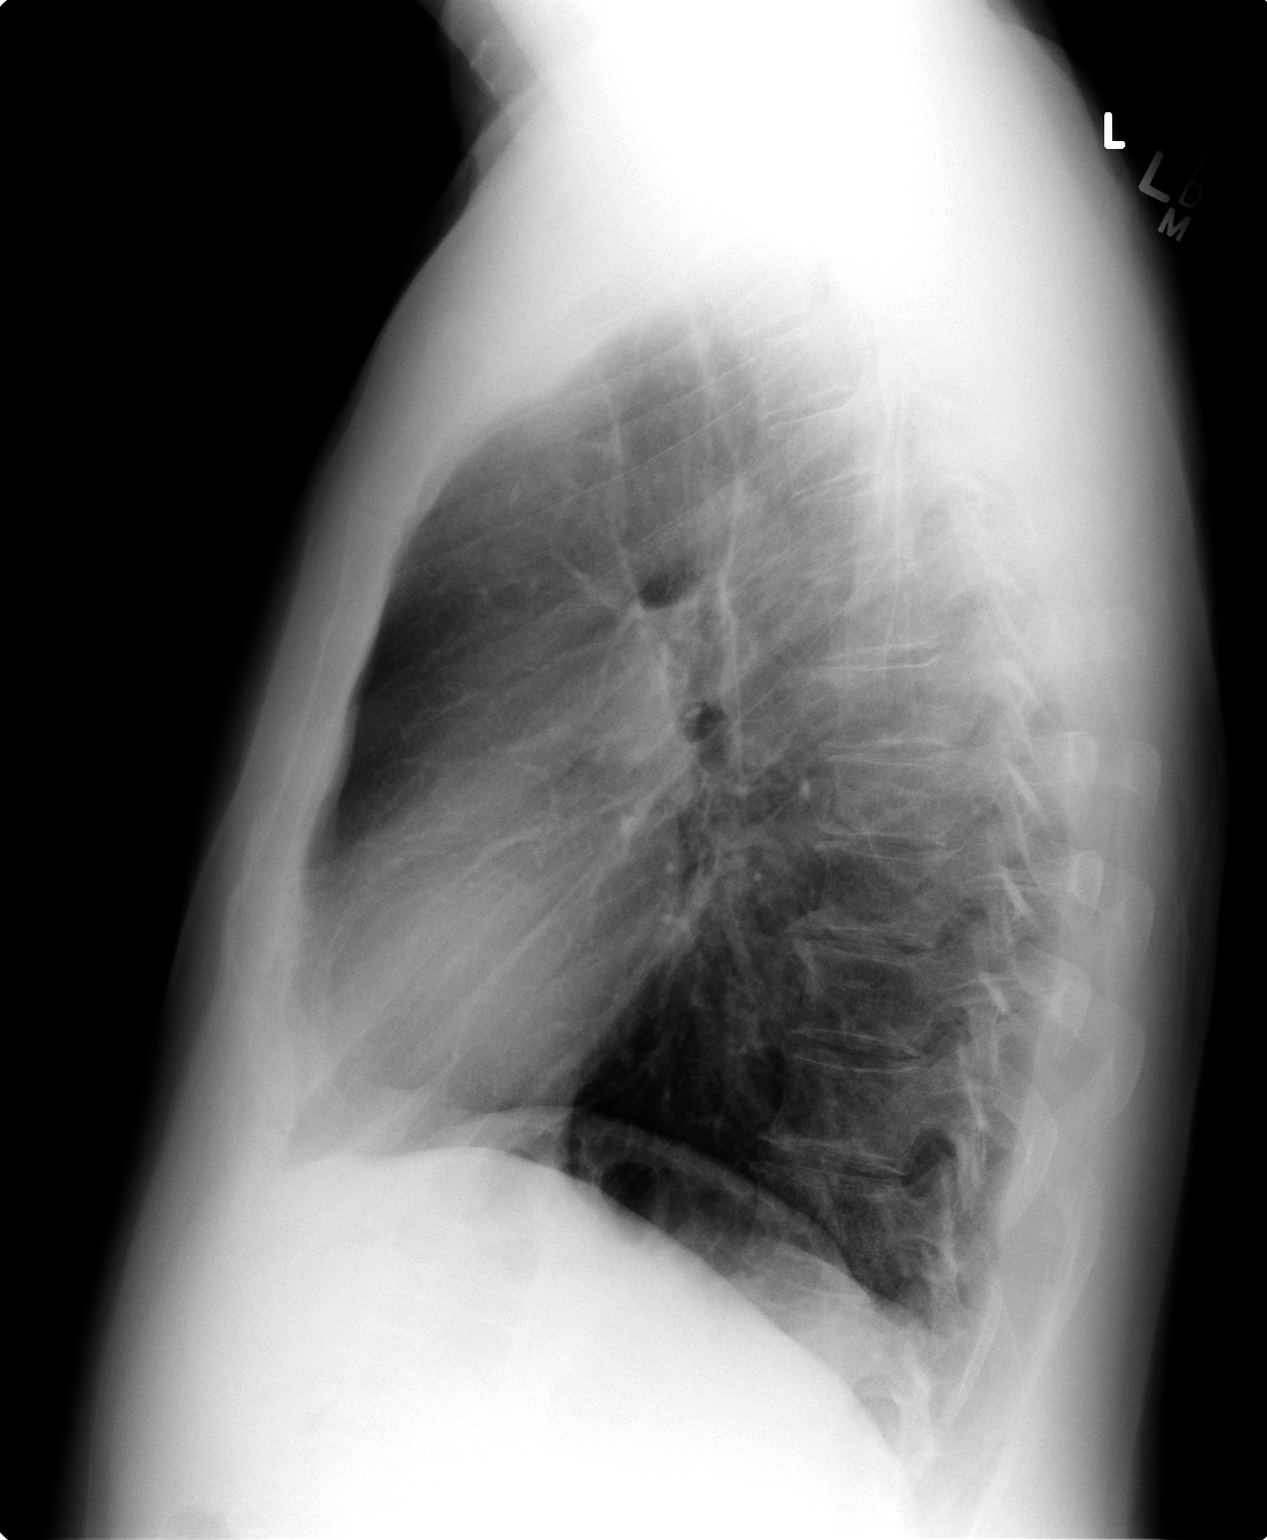

[2 of 2 positions shown; findings below may reference images not displayed]

FINDINGS: Normal heart size and vascularity. Lungs remain clear. No focal
pneumonia, collapse or consolidation. No edema, effusion
pneumothorax. Chronic left apical scarring noted. Trachea midline.
Nonspecific soft tissue calcification in the right shoulder region.
This measures 1 cm. Minor lower thoracic endplate osteophytes.
IMPRESSION: No acute chest process.

## 2015-02-09 ENCOUNTER — Other Ambulatory Visit: Payer: Self-pay | Admitting: Family

## 2015-02-09 MED ORDER — MUPIROCIN 2 % EX OINT: 1.0000 "application " | TOPICAL_OINTMENT | Freq: Two times a day (BID) | CUTANEOUS | Status: DC

## 2015-03-24 ENCOUNTER — Ambulatory Visit (INDEPENDENT_AMBULATORY_CARE_PROVIDER_SITE_OTHER): Payer: 59 | Admitting: Cardiovascular Disease

## 2015-03-24 ENCOUNTER — Encounter: Payer: Self-pay | Admitting: Cardiovascular Disease

## 2015-03-24 VITALS — BP 137/60 | HR 73 | Ht 66.73 in | Wt 163.0 lb

## 2015-03-24 DIAGNOSIS — I2510 Atherosclerotic heart disease of native coronary artery without angina pectoris: Secondary | ICD-10-CM

## 2015-03-24 DIAGNOSIS — E785 Hyperlipidemia, unspecified: Secondary | ICD-10-CM

## 2015-03-24 MED ORDER — METOPROLOL TARTRATE 25 MG PO TABS: 25.0000 mg | ORAL_TABLET | Freq: Two times a day (BID) | ORAL | Status: DC

## 2015-03-24 MED ORDER — NITROSTAT 0.4 MG SL SUBL: SUBLINGUAL_TABLET | SUBLINGUAL | Status: DC

## 2015-03-24 MED ORDER — CRESTOR 40 MG PO TABS: 40.0000 mg | ORAL_TABLET | Freq: Every day | ORAL | Status: DC

## 2015-03-24 NOTE — Patient Instructions (Signed)

## 2015-03-24 NOTE — Progress Notes (Signed)
Cardiology Office Note Date:  03/24/2015   ID:  Robert Limes, Robert Owen, DOB 1946/02/12, MRN [id number]  PCP:  Joycelyn Man, Robert Owen  Cardiologist:  Sherren Mocha, Robert Owen    Chief Complaint  Patient presents with  . Follow-up    CAD   History of Present Illness: Robert Limes, Robert Owen is a 69 y.o. male who presents for follow-up of CAD. He has known coronary artery disease after presenting with a non-ST elevation infarct in 2013. He was treated with drug-eluting stents in the right coronary artery.  He had an abnormal treadmill study in 2014 and underwent repeat cardiac catheterization demonstrating patency of his stent sites and moderate nonobstructive disease elsewhere.  Overall he reports no change in symptoms. He denies exertional angina. He plans on retiring at the end of this year. 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 No shortness of breath, edema, or heart palpitations.   Past Medical History  Diagnosis Date  . Hyperlipidemia   . Other abnormal glucose     A1c normal  . HTN (hypertension)   . CAD (coronary artery disease)     a. S/P NSTEMI 10/20/11: LHC at [facility] in [place], [place]:  mRCA 70% and 80% hazy, dRCA 90%, EF 65%; >>  PCI: Promus DES x 2 (3x38 mm and 3x12 mm) to mid and distal RCA;  b.  LHC (6/14):  Mid LAD 40-50, ostial circumflex 30-40, mid and distal RCA stents patent, proximal PDA 40, EF 55%  . Cervical disc disease     C 6 L radiculopathy on L    Past Surgical History  Procedure Laterality Date  . Tonsillectomy and adenoidectomy    . Wisdom tooth extraction  1972  . Cardiac catheterization  10/21/11    [place] , [place]; 2 stents RCA  . Colonoscopy      Dr  Deatra Ina , negative  . Cardiac catheterization  10/2012    Dr Burt Knack, negative  . Cataract extraction  11/2012    Dr Bing Plume, OD  . Left heart catheterization with coronary angiogram N/A 11/14/2012    Procedure: LEFT HEART CATHETERIZATION WITH CORONARY ANGIOGRAM;   Surgeon: Sherren Mocha, Robert Owen;  Location: [facility] CATH LAB;  Service: Cardiovascular;  Laterality: N/A;    Current Outpatient Prescriptions  Medication Sig Dispense Refill  . aspirin 81 MG tablet Take 81 mg by mouth daily.    . CRESTOR 40 MG tablet Take 1 tablet (40 mg total) by mouth daily. 90 tablet 3  . metoprolol tartrate (LOPRESSOR) 25 MG tablet Take 1 tablet (25 mg total) by mouth 2 (two) times daily. 180 tablet 3  . NITROSTAT 0.4 MG SL tablet PLACE 1 TABLET UNDER THE TONGUE EVERY 5 MINUTES AS NEEDED FOR CHEST PAIN. 25 tablet 3   No current facility-administered medications for this visit.    Allergies:   Eugenol   Social History:  The patient  reports that he has never smoked. He has never used smokeless tobacco. He reports that he drinks alcohol. He reports that he does not use illicit drugs.   Family History:  The patient's family history includes Alcohol abuse in his father and mother; Dementia in his paternal grandmother; Diabetes in his brother and sister; Heart attack (age of onset: 21) in his father; Rheum arthritis in his paternal grandfather.    ROS:  Please see the history of present illness.  Otherwise, review of systems is positive for work-related stress.  All other systems  are reviewed and negative.    PHYSICAL EXAM: VS:  BP 137/60 mmHg  Pulse 73  Ht 5' 6.73" (1.695 m)  Wt 163 lb (73.936 kg)  BMI 25.73 kg/m2 , BMI Body mass index is 25.73 kg/(m^2). GEN: Well nourished, well developed, in no acute distress HEENT: normal Neck: no JVD, no masses. No carotid bruits Cardiac: RRR without murmur or gallop                Respiratory:  clear to auscultation bilaterally, normal work of breathing GI: soft, nontender, nondistended, + BS MS: no deformity or atrophy Ext: no pretibial edema, pedal pulses 2+= bilaterally Skin: warm and dry, no rash Neuro:  Strength and sensation are intact Psych: euthymic mood, full affect  EKG:  EKG is ordered today. The ekg ordered today  shows NSR, within normal limits  Recent Labs: No results found for requested labs within last 365 days.   Lipid Panel     Component Value Date/Time   CHOL 154 06/30/2012 1414   TRIG 125.0 06/30/2012 1414   HDL 50.40 06/30/2012 1414   CHOLHDL 3 06/30/2012 1414   VLDL 25.0 06/30/2012 1414   LDLCALC 79 06/30/2012 1414      Wt Readings from Last 3 Encounters:  03/24/15 163 lb (73.936 kg)  04/29/14 163 lb (73.936 kg)  04/02/14 165 lb (74.844 kg)     Cardiac Studies Reviewed: Cardiac Cath 11/14/2012: Coronary angiography: Coronary dominance: right  Left mainstem: The left main is patent. There is no significant obstructive disease. There is mild irregularity noted. There is mild calcification present.  Left anterior descending (LAD): The LAD is patent to the apex. The vessel is diffusely diseased but there are no areas of high-grade stenosis. The proximal LAD is patent with minor irregularity. The mid LAD beyond the first diagonal branch has diffuse 40-50% stenosis. The diagonal branches are patent. Further down in the mid LAD and in the distal LAD there are no significant stenoses.  Left circumflex (LCx): The left circumflex is patent. There is 30-40% ostial stenosis. There is a large first OM with no significant stenosis. The second OM is smaller without significant disease. The continuation of the AV groove circumflex has no significant disease.  Right coronary artery (RCA): The right coronary artery is patent throughout its course. There is no obstructive disease proximally. There is minor regularity noted. The stented segment in the mid vessel has no significant in-stent restenosis. There is mild restenosis noted but it is clearly not flow obstructive. The distal vessel is widely patent. The PDA branch has mild disease in its proximal aspect of about 40%. The PLA branch is small with no significant disease.  Left ventriculography: Left ventricular systolic function is normal, LVEF is  estimated at 55%, there is no significant mitral regurgitation   Final Conclusions:  1. Continued patency of the stented segment in the mid right coronary artery 2. Diffuse nonobstructive coronary artery disease as detailed above 3. Normal left ventricular systolic function  Recommendations: Continued medical therapy. The patient is out beyond 12 months from PCI and he can discontinue effient at low risk of stent thrombosis at this point.  ASSESSMENT AND PLAN: 1.  CAD, native vessel, without symptoms of angina: The patient is stable on aspirin and a statin drug as well as a beta blocker. Will continue his current  medical regimen and see him back in one year.   2. Hyperlipidemia: Reviewed lipids from 09/09/2014. HDL is 66, LDL is 100 mg/dL. Continue  Crestor 40 mg daily.  3. HTN: controlled. Continue medical therapy with metoprolol.  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 He has a lot of work-related stress and I think retirement will be good for his overall health. I will see him back in one year.  Current medicines are reviewed with the patient today.  The patient does not have concerns regarding medicines.  Labs/ tests ordered today include:   Orders Placed This Encounter  Procedures  . EKG 12-Lead   Disposition:   FU one year  Signed, Sherren Mocha, Robert Owen  03/24/2015 1:25 PM    [facility] [place], [place], [place]  [address] Phone: [phone]; Fax: [phone]

## 2015-03-29 ENCOUNTER — Telehealth: Payer: Self-pay | Admitting: *Deleted

## 2015-03-29 NOTE — Telephone Encounter (Signed)
Okay to change to generic nitroglycerin. Please notify pharmacy.

## 2015-03-29 NOTE — Telephone Encounter (Signed)
Fax received from [facility] wanting to know if ok to change nitrostat to generic. It was sent in as dispense as written. Please advise. Thanks, MI

## 2015-03-29 NOTE — Telephone Encounter (Signed)
Pharmacy aware

## 2015-03-31 ENCOUNTER — Other Ambulatory Visit: Payer: Self-pay

## 2015-03-31 ENCOUNTER — Telehealth: Payer: Self-pay

## 2015-03-31 NOTE — Telephone Encounter (Signed)
The patient's pharmacy is asking if they can fill Nitrostat Rx  with a generic.

## 2015-03-31 NOTE — Telephone Encounter (Signed)
Yes this is fine.

## 2015-04-11 ENCOUNTER — Telehealth: Payer: Self-pay

## 2015-04-11 NOTE — Telephone Encounter (Signed)
-----   Message from [name], MD sent at 04/09/2015 10:36 AM EDT ----- Are either 11/22 or 11/29 OK ? I'm off all day both days. Thanks for your help. Hopp ----- Message -----    From: Marlon Pel, RN    Sent: 04/08/2015   9:08 AM      To: [name], MD  I have you on.  We do need to have you come in for a pre-visit to update your chart, sign paperwork, and give you instructions.  We can do these most any day.  Is there a better time/day to come?  Appts start at 8:00-4:30 M-F. ----- Message -----    From: [name], MD    Sent: 04/07/2015   5:46 PM      To: Marlon Pel, RN  12/6 would be great. Thanks, Hopp ----- Message -----    From: Marlon Pel, RN    Sent: 04/07/2015  10:45 AM      To: [name], MD  Hi Dr. Nira Retort.  Sorry I have been out for a few days.  When is a good day for you to set up your colonoscopy?  I can put you in any of the following days to arrive at 7:00.  Just let me know what day is good for you. 11/14 11/16/ 11/17 11/21 11/23  12/6 12/8 12/22  Thanks,  Abdulraheem Pineo          ----- Message -----    From: Gatha Mayer, MD    Sent: 04/04/2015   4:29 PM      To: Marlon Pel, RN, [name], MD  Sure - I will have my RN contact you to arrange your (hopefully last) screening colonoscopy. I have cced her on this note.  Glendell Docker ----- Message -----    From: [name], MD    Sent: 04/04/2015   3:43 PM      To: Gatha Mayer, MD  Glendell Docker I believe Alben Spittle did my last colonoscopy @ age 52. I am now 26.Could I schedule F/U with you or one of the other GIs before I retire 06/11/15? I have no GI symptoms. Thanks, [employer]

## 2015-04-14 ENCOUNTER — Other Ambulatory Visit: Payer: Self-pay | Admitting: Family Medicine

## 2015-04-14 DIAGNOSIS — K4090 Unilateral inguinal hernia, without obstruction or gangrene, not specified as recurrent: Secondary | ICD-10-CM

## 2015-05-03 ENCOUNTER — Encounter: Payer: Self-pay | Admitting: Gastroenterology

## 2015-05-03 ENCOUNTER — Ambulatory Visit (AMBULATORY_SURGERY_CENTER): Payer: Self-pay | Admitting: *Deleted

## 2015-05-03 VITALS — Ht 67.5 in | Wt 167.8 lb

## 2015-05-03 DIAGNOSIS — Z1211 Encounter for screening for malignant neoplasm of colon: Secondary | ICD-10-CM

## 2015-05-03 NOTE — Progress Notes (Signed)
No egg or soy allergy No issues with past sedation No diet pills No home 02 use  emmi declined  

## 2015-05-10 ENCOUNTER — Encounter: Payer: Self-pay | Admitting: *Deleted

## 2015-05-17 ENCOUNTER — Encounter: Payer: Self-pay | Admitting: Internal Medicine

## 2015-05-17 ENCOUNTER — Ambulatory Visit (AMBULATORY_SURGERY_CENTER): Payer: 59 | Admitting: Internal Medicine

## 2015-05-17 VITALS — BP 142/71 | HR 61 | Temp 96.9°F | Resp 10 | Ht 67.0 in | Wt 167.0 lb

## 2015-05-17 DIAGNOSIS — Z1211 Encounter for screening for malignant neoplasm of colon: Secondary | ICD-10-CM | POA: Diagnosis not present

## 2015-05-17 DIAGNOSIS — D125 Benign neoplasm of sigmoid colon: Secondary | ICD-10-CM | POA: Diagnosis not present

## 2015-05-17 MED ORDER — SODIUM CHLORIDE 0.9 % IV SOLN
500.0000 mL | INTRAVENOUS | Status: DC
Start: 2015-05-17 — End: 2015-05-17

## 2015-05-17 NOTE — Op Note (Signed)
[place]  [address]. [place], [address]   COLONOSCOPY PROCEDURE REPORT  PATIENT: Robert Owen, Robert Owen  MR#: [id number] BIRTHDATE: 04-10-1946 , 69  yrs. old GENDER: male ENDOSCOPIST: Gatha Mayer, MD, [facility] PROCEDURE DATE:  05/17/2015 PROCEDURE:   Colonoscopy, screening and Colonoscopy with snare polypectomy First Screening Colonoscopy - Avg.  risk and is 50 yrs.  old or older - No.  Prior Negative Screening - Now for repeat screening. 10 or more years since last screening  History of Adenoma - Now for follow-up colonoscopy & has been > or = to 3 yrs.  N/A  Polyps removed today? Yes ASA CLASS:   Class II INDICATIONS:Screening for colonic neoplasia and Colorectal Neoplasm Risk Assessment for this procedure is average risk. MEDICATIONS: Propofol 200 mg IV and Monitored anesthesia care  DESCRIPTION OF PROCEDURE:   After the risks benefits and alternatives of the procedure were thoroughly explained, informed consent was obtained.  The digital rectal exam revealed no rectal mass and revealed an enlarged prostate.   The [id number] endoscope was introduced through the anus and advanced to the cecum, which was identified by both the appendix and ileocecal valve. No adverse events experienced.   The quality of the prep was excellent.  (MiraLax was used)  The instrument was then slowly withdrawn as the colon was fully examined. Estimated blood loss is zero unless otherwise noted in this procedure report.      COLON FINDINGS: Three polypoid shaped sessile polyps ranging from 4 to 84mm in size were found in the sigmoid colon.  Polypectomies were performed with a cold snare.  The resection was complete, the polyp tissue was completely retrieved and sent to histology.   The examination was otherwise normal.  Retroflexed views revealed no abnormalities. The time to cecum = 5.0 Withdrawal time = 10.6   The scope was withdrawn and the procedure  completed. COMPLICATIONS: There were no immediate complications.  ENDOSCOPIC IMPRESSION: 1.   Three sessile polyps ranging from 4 to 58mm in size were found in the sigmoid colon; polypectomies were performed with a cold snare 2.   The colon examination was otherwise normal 3.    Moderately enlarged but smooth prostate  RECOMMENDATIONS: Timing of repeat colonoscopy will be determined by pathology findings.  eSigned:  Gatha Mayer, MD, [facility] 05/17/2015 8:40 AM   cc: The Patient

## 2015-05-17 NOTE — Progress Notes (Signed)
Called to room to assist during endoscopic procedure.  Patient ID and intended procedure confirmed with present staff. Received instructions for my participation in the procedure from the performing physician.  

## 2015-05-17 NOTE — Progress Notes (Signed)
To PACU  Awake with spont resp, vss, report to RN

## 2015-05-17 NOTE — Patient Instructions (Addendum)
I found and removed 3 small polyps that look benign. I thought prostate moderately enlarged but it was smooth. All else ok.  I will let you know pathology results and when to have another routine colonoscopy by mail.  I appreciate the opportunity to care for you. Gatha Mayer, MD, FACG YOU HAD AN ENDOSCOPIC PROCEDURE TODAY AT [facility]:   Refer to the procedure report that was given to you for any specific questions about what was found during the examination.  If the procedure report does not answer your questions, please call your gastroenterologist to clarify.  If you requested that your care partner not be given the details of your procedure findings, then the procedure report has been included in a sealed envelope for you to review at your convenience later.  YOU SHOULD EXPECT: Some feelings of bloating in the abdomen. Passage of more gas than usual.  Walking can help get rid of the air that was put into your GI tract during the procedure and reduce the bloating. If you had a lower endoscopy (such as a colonoscopy or flexible sigmoidoscopy) you may notice spotting of blood in your stool or on the toilet paper. If you underwent a bowel prep for your procedure, you may not have a normal bowel movement for a few days.  Please Note:  You might notice some irritation and congestion in your nose or some drainage.  This is from the oxygen used during your procedure.  There is no need for concern and it should clear up in a day or so.  SYMPTOMS TO REPORT IMMEDIATELY:   Following lower endoscopy (colonoscopy or flexible sigmoidoscopy):  Excessive amounts of blood in the stool  Significant tenderness or worsening of abdominal pains  Swelling of the abdomen that is new, acute  Fever of 100F or higher   For urgent or emergent issues, a gastroenterologist can be reached at any hour by calling [phone].   DIET: Your first meal following the procedure should be  a small meal and then it is ok to progress to your normal diet. Heavy or fried foods are harder to digest and may make you feel nauseous or bloated.  Likewise, meals heavy in dairy and vegetables can increase bloating.  Drink plenty of fluids but you should avoid alcoholic beverages for 24 hours.  ACTIVITY:  You should plan to take it easy for the rest of today and you should NOT DRIVE or use heavy machinery until tomorrow (because of the sedation medicines used during the test).    FOLLOW UP: Our staff will call the number listed on your records the next business day following your procedure to check on you and address any questions or concerns that you may have regarding the information given to you following your procedure. If we do not reach you, we will leave a message.  However, if you are feeling well and you are not experiencing any problems, there is no need to return our call.  We will assume that you have returned to your regular daily activities without incident.  If any biopsies were taken you will be contacted by phone or by letter within the next 1-3 weeks.  Please call us at [phone] if you have not heard about the biopsies in 3 weeks.    SIGNATURES/CONFIDENTIALITY: You and/or your care partner have signed paperwork which will be entered into your electronic medical record.  These signatures attest to the fact that that the  information above on your After Visit Summary has been reviewed and is understood.  Full responsibility of the confidentiality of this discharge information lies with you and/or your care-partner.

## 2015-05-18 ENCOUNTER — Telehealth: Payer: Self-pay

## 2015-05-18 ENCOUNTER — Other Ambulatory Visit: Payer: Self-pay | Admitting: Family

## 2015-05-18 MED ORDER — ALBUTEROL SULFATE 108 (90 BASE) MCG/ACT IN AEPB: 1.0000 | INHALATION_SPRAY | RESPIRATORY_TRACT | Status: DC | PRN

## 2015-05-18 NOTE — Telephone Encounter (Signed)
  Follow up Call-  Call back number 05/17/2015  Post procedure Call Back phone  # [phone]  Permission to leave phone message Yes     Patient questions:  Do you have a fever, pain , or abdominal swelling? No. Pain Score  0 *  Have you tolerated food without any problems? Yes.    Have you been able to return to your normal activities? Yes.    Do you have any questions about your discharge instructions: Diet   No. Medications  No. Follow up visit  No.  Do you have questions or concerns about your Care? No.  Actions: * If pain score is 4 or above: No action needed, pain <4.

## 2015-05-23 ENCOUNTER — Encounter: Payer: Self-pay | Admitting: Internal Medicine

## 2015-05-23 DIAGNOSIS — Z860101 Personal history of adenomatous and serrated colon polyps: Secondary | ICD-10-CM

## 2015-05-23 DIAGNOSIS — Z86010 Personal history of colonic polyps: Secondary | ICD-10-CM

## 2015-05-23 HISTORY — DX: Personal history of adenomatous and serrated colon polyps: Z86.0101

## 2015-05-23 HISTORY — DX: Personal history of colonic polyps: Z86.010

## 2015-05-23 NOTE — Progress Notes (Signed)
Quick Note:  3 adenomas max 8 mm so repeat colonoscopy 3 yrs 2019-20 ______

## 2015-05-26 ENCOUNTER — Ambulatory Visit (INDEPENDENT_AMBULATORY_CARE_PROVIDER_SITE_OTHER): Payer: 59 | Admitting: Family

## 2015-05-26 DIAGNOSIS — J069 Acute upper respiratory infection, unspecified: Secondary | ICD-10-CM

## 2015-05-26 DIAGNOSIS — H6123 Impacted cerumen, bilateral: Secondary | ICD-10-CM | POA: Diagnosis not present

## 2015-05-26 DIAGNOSIS — H6982 Other specified disorders of Eustachian tube, left ear: Secondary | ICD-10-CM

## 2015-05-26 DIAGNOSIS — H6990 Unspecified Eustachian tube disorder, unspecified ear: Secondary | ICD-10-CM | POA: Insufficient documentation

## 2015-05-26 DIAGNOSIS — H6120 Impacted cerumen, unspecified ear: Secondary | ICD-10-CM | POA: Insufficient documentation

## 2015-05-26 DIAGNOSIS — H6980 Other specified disorders of Eustachian tube, unspecified ear: Secondary | ICD-10-CM | POA: Insufficient documentation

## 2015-05-26 MED ORDER — AZITHROMYCIN 250 MG PO TABS: ORAL_TABLET | ORAL | Status: DC

## 2015-05-26 NOTE — Assessment & Plan Note (Signed)
Symptoms and exam consistent with acute upper respiratory infection. Continue over the counter medications as needed for symptom relief and supportive care. Start azithromycin if symptoms do not improve in the next 2-3 days. Follow up if symptoms worsen or do not improve after starting azithromycin.

## 2015-05-26 NOTE — Assessment & Plan Note (Signed)
Recommend ear cleaning with OTC medications and follow up for in office cleaning if needed.

## 2015-05-26 NOTE — Patient Instructions (Signed)
Thank you for choosing [facility].  Summary/Instructions:  Your prescription(s) have been submitted to your pharmacy or been printed and provided for you. Please take as directed and contact our office if you believe you are having problem(s) with the medication(s) or have any questions.  If your symptoms worsen or fail to improve, please contact our office for further instruction, or in case of emergency go directly to the emergency room at the closest medical facility.     

## 2015-05-26 NOTE — Assessment & Plan Note (Signed)
Symptoms and exam consistent with Eustachian tube dysfunction. Recommend nasal corticosteroid as needed. Follow up if symptoms worsen or do not improve.

## 2015-05-26 NOTE — Progress Notes (Signed)
   Subjective:    Patient ID: Robert Limes, MD, male    DOB: [birth year], 69 y.o.   MRN: [id number]  No chief complaint on file.   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 and presents today for an acute office visit.  This is a new problem. Associated symptoms of sore throat, ear pressure, and non-productive cough have been going on for about 10 days. Modifying factors include Albuterol and Symbicort that help with the breathing symptoms. Does note occasional thick, clear secretions. Timing of the symptoms is generally worse in the morning. Denies fevers. No recent antibiotic use.  Allergies  Allergen Reactions  . Eugenol     Topical dental anesthetic caused migraine type headache     Current Outpatient Prescriptions on File Prior to Visit  Medication Sig Dispense Refill  . Albuterol Sulfate (PROAIR RESPICLICK) [id number] (90 BASE) MCG/ACT AEPB Inhale 1-2 puffs into the lungs every 4 (four) hours as needed. 1 each 0  . aspirin 81 MG tablet Take 81 mg by mouth daily.    . CRESTOR 40 MG tablet Take 1 tablet (40 mg total) by mouth daily. 90 tablet 3  . metoprolol tartrate (LOPRESSOR) 25 MG tablet Take 1 tablet (25 mg total) by mouth 2 (two) times daily. 180 tablet 3  . NITROSTAT 0.4 MG SL tablet PLACE 1 TABLET UNDER THE TONGUE EVERY 5 MINUTES AS NEEDED FOR CHEST PAIN. (Patient not taking: Reported on 05/17/2015) 25 tablet 3   No current facility-administered medications on file prior to visit.    Review of Systems  Constitutional: Negative for fever and chills.  HENT: Positive for congestion, ear pain and sore throat. Negative for sinus pressure.   Respiratory: Positive for cough. Negative for chest tightness, shortness  of breath and wheezing.   Neurological: Negative for headaches.      Objective:    There were no vitals taken for this visit. Nursing note and vital signs reviewed.  Physical Exam  Constitutional: He is oriented to person, place, and time. He appears well-developed and well-nourished. No distress.  HENT:  Right Ear: Hearing, external ear and ear canal normal.  Left Ear: Hearing, external ear and ear canal normal.  Nose: Nose normal. Right sinus exhibits no maxillary sinus tenderness and no frontal sinus tenderness. Left sinus exhibits no maxillary sinus tenderness and no frontal sinus tenderness.  Mouth/Throat: Uvula is midline, oropharynx is clear and moist and mucous membranes are normal.  Impacted cerumen noted in bilateral ears obstructing view of TM.   Cardiovascular: Normal rate, regular rhythm, normal heart sounds and intact distal pulses.   Pulmonary/Chest: Effort normal and breath sounds normal.  Neurological: He is alert and oriented to person, place, and time.  Skin: Skin is warm and dry.  Psychiatric: He has a normal mood and affect. His behavior is normal. Judgment and thought content normal.       Assessment & Plan:   Problem List Items Addressed This Visit    None

## 2015-05-27 ENCOUNTER — Other Ambulatory Visit: Payer: Self-pay | Admitting: Family

## 2015-05-27 MED ORDER — MONTELUKAST SODIUM 10 MG PO TABS: 10.0000 mg | ORAL_TABLET | Freq: Every day | ORAL | Status: DC

## 2015-06-15 ENCOUNTER — Encounter (HOSPITAL_BASED_OUTPATIENT_CLINIC_OR_DEPARTMENT_OTHER): Payer: Self-pay | Admitting: *Deleted

## 2015-06-16 ENCOUNTER — Encounter (HOSPITAL_BASED_OUTPATIENT_CLINIC_OR_DEPARTMENT_OTHER)
Admission: RE | Admit: 2015-06-16 | Discharge: 2015-06-16 | Disposition: A | Discharge status: Home / Self Care | Payer: PPO | Source: Ambulatory Visit | Attending: General Surgery | Admitting: General Surgery

## 2015-06-16 ENCOUNTER — Other Ambulatory Visit: Payer: Self-pay | Admitting: General Surgery

## 2015-06-16 DIAGNOSIS — Z01818 Encounter for other preprocedural examination: Secondary | ICD-10-CM | POA: Diagnosis not present

## 2015-06-16 DIAGNOSIS — K4090 Unilateral inguinal hernia, without obstruction or gangrene, not specified as recurrent: Secondary | ICD-10-CM | POA: Diagnosis not present

## 2015-06-16 LAB — BASIC METABOLIC PANEL
Anion gap: 7 (ref 5–15)
BUN: 16 mg/dL (ref 6–20)
CALCIUM: 9.4 mg/dL (ref 8.9–10.3)
CHLORIDE: 107 mmol/L (ref 101–111)
CO2: 27 mmol/L (ref 22–32)
CREATININE: 0.86 mg/dL (ref 0.61–1.24)
GFR calc non Af Amer: >60 mL/min (ref >60)
Glucose, Bld: 121 mg/dL — ABNORMAL HIGH (ref 65–99)
Potassium: 5.2 mmol/L — ABNORMAL HIGH (ref 3.5–5.1)
SODIUM: 141 mmol/L (ref 135–145)

## 2015-06-20 ENCOUNTER — Encounter (HOSPITAL_BASED_OUTPATIENT_CLINIC_OR_DEPARTMENT_OTHER)
Admission: RE | Disposition: A | Discharge status: Home / Self Care | Payer: Self-pay | Source: Ambulatory Visit | Attending: General Surgery

## 2015-06-20 ENCOUNTER — Ambulatory Visit (HOSPITAL_BASED_OUTPATIENT_CLINIC_OR_DEPARTMENT_OTHER): Payer: PPO | Admitting: Anesthesiology

## 2015-06-20 ENCOUNTER — Encounter (HOSPITAL_BASED_OUTPATIENT_CLINIC_OR_DEPARTMENT_OTHER): Payer: Self-pay | Admitting: Anesthesiology

## 2015-06-20 ENCOUNTER — Ambulatory Visit (HOSPITAL_BASED_OUTPATIENT_CLINIC_OR_DEPARTMENT_OTHER)
Admission: RE | Admit: 2015-06-20 | Discharge: 2015-06-20 | Disposition: A | Discharge status: Home / Self Care | Payer: PPO | Source: Ambulatory Visit | Attending: General Surgery | Admitting: General Surgery

## 2015-06-20 DIAGNOSIS — G8918 Other acute postprocedural pain: Secondary | ICD-10-CM | POA: Diagnosis not present

## 2015-06-20 DIAGNOSIS — I252 Old myocardial infarction: Secondary | ICD-10-CM | POA: Insufficient documentation

## 2015-06-20 DIAGNOSIS — K4090 Unilateral inguinal hernia, without obstruction or gangrene, not specified as recurrent: Secondary | ICD-10-CM | POA: Insufficient documentation

## 2015-06-20 DIAGNOSIS — I10 Essential (primary) hypertension: Secondary | ICD-10-CM | POA: Diagnosis not present

## 2015-06-20 DIAGNOSIS — M1990 Unspecified osteoarthritis, unspecified site: Secondary | ICD-10-CM | POA: Insufficient documentation

## 2015-06-20 DIAGNOSIS — Z7982 Long term (current) use of aspirin: Secondary | ICD-10-CM | POA: Diagnosis not present

## 2015-06-20 DIAGNOSIS — E7800 Pure hypercholesterolemia, unspecified: Secondary | ICD-10-CM | POA: Insufficient documentation

## 2015-06-20 DIAGNOSIS — Z79899 Other long term (current) drug therapy: Secondary | ICD-10-CM | POA: Diagnosis not present

## 2015-06-20 DIAGNOSIS — R109 Unspecified abdominal pain: Secondary | ICD-10-CM | POA: Diagnosis not present

## 2015-06-20 HISTORY — PX: INSERTION OF MESH: SHX5868

## 2015-06-20 HISTORY — PX: INGUINAL HERNIA REPAIR: SHX194

## 2015-06-20 SURGERY — REPAIR, HERNIA, INGUINAL, ADULT
Anesthesia: General | Site: Groin | Laterality: Left

## 2015-06-20 MED ORDER — MIDAZOLAM HCL 2 MG/2ML IJ SOLN
INTRAMUSCULAR | Status: AC
  Filled 2015-06-20: qty 2

## 2015-06-20 MED ORDER — BUPIVACAINE HCL (PF) 0.5 % IJ SOLN
INTRAMUSCULAR | Status: DC | PRN
  Administered 2015-06-20: 30 mL

## 2015-06-20 MED ORDER — LIDOCAINE HCL (CARDIAC) 20 MG/ML IV SOLN
INTRAVENOUS | Status: DC | PRN
  Administered 2015-06-20: 50 mg via INTRAVENOUS

## 2015-06-20 MED ORDER — FENTANYL CITRATE (PF) 100 MCG/2ML IJ SOLN
INTRAMUSCULAR | Status: AC
  Filled 2015-06-20: qty 2

## 2015-06-20 MED ORDER — GLYCOPYRROLATE 0.2 MG/ML IJ SOLN: 0.2000 mg | Freq: Once | INTRAMUSCULAR | Status: DC | PRN

## 2015-06-20 MED ORDER — FENTANYL CITRATE (PF) 100 MCG/2ML IJ SOLN: 25.0000 ug | INTRAMUSCULAR | Status: DC | PRN

## 2015-06-20 MED ORDER — ONDANSETRON HCL 4 MG/2ML IJ SOLN
INTRAMUSCULAR | Status: DC | PRN
  Administered 2015-06-20: 4 mg via INTRAVENOUS

## 2015-06-20 MED ORDER — KETOROLAC TROMETHAMINE 15 MG/ML IJ SOLN: 15.0000 mg | Freq: Once | INTRAMUSCULAR | Status: DC

## 2015-06-20 MED ORDER — PROPOFOL 10 MG/ML IV BOLUS
INTRAVENOUS | Status: DC | PRN
  Administered 2015-06-20: 150 mg via INTRAVENOUS

## 2015-06-20 MED ORDER — CEFAZOLIN SODIUM-DEXTROSE 2-3 GM-% IV SOLR
2.0000 g | INTRAVENOUS | Status: AC
  Administered 2015-06-20: 2 g via INTRAVENOUS

## 2015-06-20 MED ORDER — LACTATED RINGERS IV SOLN
INTRAVENOUS | Status: DC
  Administered 2015-06-20: 10:00:00 via INTRAVENOUS
  Administered 2015-06-20: 10 mL/h via INTRAVENOUS

## 2015-06-20 MED ORDER — TRAMADOL HCL 50 MG PO TABS
50.0000 mg | ORAL_TABLET | Freq: Once | ORAL | Status: AC
Start: 2015-06-20 — End: 2015-06-20
  Administered 2015-06-20: 50 mg via ORAL

## 2015-06-20 MED ORDER — CEFAZOLIN SODIUM-DEXTROSE 2-3 GM-% IV SOLR
INTRAVENOUS | Status: AC
  Filled 2015-06-20: qty 50

## 2015-06-20 MED ORDER — FENTANYL CITRATE (PF) 100 MCG/2ML IJ SOLN
INTRAMUSCULAR | Status: DC | PRN
  Administered 2015-06-20: 100 ug via INTRAVENOUS

## 2015-06-20 MED ORDER — FENTANYL CITRATE (PF) 100 MCG/2ML IJ SOLN
50.0000 ug | INTRAMUSCULAR | Status: DC | PRN
  Administered 2015-06-20: 50 ug via INTRAVENOUS

## 2015-06-20 MED ORDER — BUPIVACAINE HCL (PF) 0.25 % IJ SOLN
INTRAMUSCULAR | Status: AC
  Filled 2015-06-20: qty 30

## 2015-06-20 MED ORDER — DEXAMETHASONE SODIUM PHOSPHATE 4 MG/ML IJ SOLN
INTRAMUSCULAR | Status: DC | PRN
  Administered 2015-06-20: 10 mg via INTRAVENOUS

## 2015-06-20 MED ORDER — DEXAMETHASONE SODIUM PHOSPHATE 10 MG/ML IJ SOLN
INTRAMUSCULAR | Status: AC
  Filled 2015-06-20: qty 1

## 2015-06-20 MED ORDER — PROMETHAZINE HCL 25 MG/ML IJ SOLN: 6.2500 mg | INTRAMUSCULAR | Status: DC | PRN

## 2015-06-20 MED ORDER — HYDROCODONE-ACETAMINOPHEN 10-325 MG PO TABS: 1.0000 | ORAL_TABLET | Freq: Four times a day (QID) | ORAL | Status: DC | PRN

## 2015-06-20 MED ORDER — LIDOCAINE HCL (CARDIAC) 20 MG/ML IV SOLN
INTRAVENOUS | Status: AC
  Filled 2015-06-20: qty 5

## 2015-06-20 MED ORDER — PROPOFOL 10 MG/ML IV BOLUS
INTRAVENOUS | Status: AC
  Filled 2015-06-20: qty 40

## 2015-06-20 MED ORDER — MIDAZOLAM HCL 2 MG/2ML IJ SOLN
1.0000 mg | INTRAMUSCULAR | Status: DC | PRN
  Administered 2015-06-20: 1 mg via INTRAVENOUS

## 2015-06-20 MED ORDER — BUPIVACAINE HCL (PF) 0.25 % IJ SOLN
INTRAMUSCULAR | Status: DC | PRN
  Administered 2015-06-20: 6 mL

## 2015-06-20 MED ORDER — TRAMADOL HCL 50 MG PO TABS
ORAL_TABLET | ORAL | Status: AC
  Filled 2015-06-20: qty 1

## 2015-06-20 MED ORDER — KETOROLAC TROMETHAMINE 30 MG/ML IJ SOLN: 30.0000 mg | Freq: Once | INTRAMUSCULAR | Status: DC | PRN

## 2015-06-20 MED ORDER — SCOPOLAMINE 1 MG/3DAYS TD PT72: 1.0000 | MEDICATED_PATCH | Freq: Once | TRANSDERMAL | Status: DC | PRN

## 2015-06-20 MED FILL — HYDROCODON-APAP 10-325: 10-325 | 4 days supply | Qty: 15 | Fill #0

## 2015-06-20 SURGICAL SUPPLY — 41 items
BLADE CLIPPER SURG (BLADE) ×1 IMPLANT
BLADE SURG 15 STRL LF DISP TIS (BLADE) ×1 IMPLANT
BLADE SURG 15 STRL SS (BLADE) ×2
CHLORAPREP W/TINT 26ML (MISCELLANEOUS) ×2 IMPLANT
COVER BACK TABLE 60X90IN (DRAPES) ×2 IMPLANT
COVER MAYO STAND STRL (DRAPES) ×2 IMPLANT
DECANTER SPIKE VIAL GLASS SM (MISCELLANEOUS) IMPLANT
DRAIN PENROSE 1/2X12 LTX STRL (WOUND CARE) ×2 IMPLANT
DRAPE LAPAROTOMY TRNSV 102X78 (DRAPE) ×2 IMPLANT
ELECT COATED BLADE 2.86 ST (ELECTRODE) ×2 IMPLANT
ELECT REM PT RETURN 9FT ADLT (ELECTROSURGICAL) ×2
ELECTRODE REM PT RTRN 9FT ADLT (ELECTROSURGICAL) ×1 IMPLANT
GLOVE BIO SURGEON STRL SZ7 (GLOVE) ×2 IMPLANT
GLOVE BIOGEL PI IND STRL 7.0 (GLOVE) IMPLANT
GLOVE BIOGEL PI IND STRL 7.5 (GLOVE) ×1 IMPLANT
GLOVE BIOGEL PI INDICATOR 7.0 (GLOVE) ×2
GLOVE BIOGEL PI INDICATOR 7.5 (GLOVE) ×1
GLOVE ECLIPSE 6.5 STRL STRAW (GLOVE) ×1 IMPLANT
GOWN STRL REUS W/ TWL LRG LVL3 (GOWN DISPOSABLE) ×2 IMPLANT
GOWN STRL REUS W/TWL LRG LVL3 (GOWN DISPOSABLE) ×4
LIQUID BAND (GAUZE/BANDAGES/DRESSINGS) ×2 IMPLANT
MARKER SKIN DUAL TIP RULER LAB (MISCELLANEOUS) ×1 IMPLANT
MESH HERNIA SYS ULTRAPRO LRG (Mesh General) ×1 IMPLANT
NEEDLE HYPO 22GX1.5 SAFETY (NEEDLE) ×2 IMPLANT
NS IRRIG 1000ML POUR BTL (IV SOLUTION) IMPLANT
PACK BASIN DAY SURGERY FS (CUSTOM PROCEDURE TRAY) ×2 IMPLANT
PENCIL BUTTON HOLSTER BLD 10FT (ELECTRODE) ×2 IMPLANT
SLEEVE SCD COMPRESS KNEE MED (MISCELLANEOUS) ×1 IMPLANT
SPONGE LAP 4X18 X RAY DECT (DISPOSABLE) ×2 IMPLANT
SUT MNCRL AB 4-0 PS2 18 (SUTURE) ×2 IMPLANT
SUT SILK 2 0 SH (SUTURE) IMPLANT
SUT VIC AB 0 SH 27 (SUTURE) IMPLANT
SUT VIC AB 2-0 SH 18 (SUTURE) ×2 IMPLANT
SUT VIC AB 2-0 SH 27 (SUTURE)
SUT VIC AB 2-0 SH 27XBRD (SUTURE) IMPLANT
SUT VIC AB 3-0 SH 27 (SUTURE) ×4
SUT VIC AB 3-0 SH 27X BRD (SUTURE) ×1 IMPLANT
SUT VICRYL AB 3 0 TIES (SUTURE) ×1 IMPLANT
SYR CONTROL 10ML LL (SYRINGE) ×2 IMPLANT
TOWEL OR 17X24 6PK STRL BLUE (TOWEL DISPOSABLE) ×2 IMPLANT
TOWEL OR NON WOVEN STRL DISP B (DISPOSABLE) ×1 IMPLANT

## 2015-06-20 NOTE — Progress Notes (Signed)
Assisted Dr. Rose with left, ultrasound guided, transabdominal plane block. Side rails up, monitors on throughout procedure. See vital signs in flow sheet. Tolerated Procedure well.  

## 2015-06-20 NOTE — H&P (Signed)
  70 yom with years long history of a left groin hernia that is becoming larger and somewhat symptomatic. 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 this mostly reduces when lying down. out most of time while up. He does have history of cardiac stents several years ago but is walking between 1-4 miles per day without any symptoms at this point.   Other Problems  Hypercholesterolemia Inguinal Hernia Myocardial infarction  Past Surgical History  Tonsillectomy  Diagnostic Studies History Colonoscopy >10 years ago  Allergies  Eugenol *CHEMICALS* Headache.  Medication History  Nitrostat (0.4MG  Tab Sublingual, Sublingual) Active. Metoprolol Tartrate (25MG  Tablet, Oral) Active. Rosuvastatin Calcium (40MG  Tablet, Oral) Active. Baby Aspirin (81MG  Tablet Chewable, Oral) Active. Medications Reconciled  Social History  Alcohol use Moderate alcohol use. Caffeine use Coffee. No drug use Tobacco use Never smoker.  Family History Alcohol Abuse Father, Mother. Depression Sister. Diabetes Mellitus Brother. Heart Disease Father. Heart disease in male family member before age 21  Review of Systems  General Not Present- Appetite Loss, Chills, Fatigue, Fever, Night Sweats, Weight Gain and Weight Loss. Skin Not Present- Change in Wart/Mole, Dryness, Hives, Jaundice, New Lesions, Non-Healing Wounds, Rash and Ulcer. HEENT Not Present- Earache, Hearing Loss, Hoarseness, Nose Bleed, Oral Ulcers, Ringing in the Ears, Seasonal Allergies, Sinus Pain, Sore Throat, Visual Disturbances, Wears glasses/contact lenses and Yellow Eyes. Respiratory Not Present- Bloody sputum, Chronic Cough, Difficulty Breathing, Snoring and Wheezing. Breast Not Present- Breast Mass, Breast Pain, Nipple Discharge and Skin Changes. Cardiovascular Not Present- Chest Pain, Difficulty Breathing Lying Down, Leg Cramps,  Palpitations, Rapid Heart Rate, Shortness of Breath and Swelling of Extremities. Gastrointestinal Not Present- Abdominal Pain, Bloating, Bloody Stool, Change in Bowel Habits, Chronic diarrhea, Constipation, Difficulty Swallowing, Excessive gas, Gets full quickly at meals, Hemorrhoids, Indigestion, Nausea, Rectal Pain and Vomiting. Male Genitourinary Not Present- Blood in Urine, Change in Urinary Stream, Frequency, Impotence, Nocturia, Painful Urination, Urgency and Urine Leakage. Musculoskeletal Not Present- Back Pain, Joint Pain, Joint Stiffness, Muscle Pain, Muscle Weakness and Swelling of Extremities. Neurological Not Present- Decreased Memory, Fainting, Headaches, Numbness, Seizures, Tingling, Tremor, Trouble walking and Weakness. Psychiatric Not Present- Anxiety, Bipolar, Change in Sleep Pattern, Depression, Fearful and Frequent crying. Endocrine Not Present- Cold Intolerance, Excessive Hunger, Hair Changes, Heat Intolerance, Hot flashes and New Diabetes. Hematology Not Present- Easy Bruising, Excessive bleeding, Gland problems, HIV and Persistent Infections.  Vitals  Weight: 166 lb Height: 68in Body Surface Area: 1.89 m Body Mass Index: 25.24 kg/m  Pulse: 66 (Regular)  BP: 142/72 (Sitting, Left Arm, Standard)   Physical Exam  General Mental Status-Alert. Orientation-Oriented X3. Abdomen Note: small nontender umbo hernia, soft Male Genitourinary Note: no rih, moderate size lih that is not able to be reduced while standing, nontender   Assessment & Plan  LEFT INGUINAL HERNIA (K40.90) Story: Open lih repair with mesh  We discussed observation versus repair. We discussed both laparoscopic and open inguinal hernia repairs. I described the procedure in detail. The patient was given educational material. Goals should be achieved with surgery. We discussed the usage of mesh and the rationale behind that. We went over the pathophysiology of inguinal hernia. We have elected  to perform open inguinal hernia repair with mesh. We discussed the risks including bleeding, infection, recurrence, postoperative pain and chronic groin pain, urinary retention, numbness in groin and around incision.

## 2015-06-20 NOTE — Interval H&P Note (Signed)
History and Physical Interval Note:  06/20/2015 9:33 AM  [name], MD  has presented today for surgery, with the diagnosis of LEFT INGUINAL HERNIA  The various methods of treatment have been discussed with the patient and family. After consideration of risks, benefits and other options for treatment, the patient has consented to  Procedure(s): LEFT INGUINAL HERNIA REPAIR WITH MESH (Left) INSERTION OF MESH (Left) as a surgical intervention .  The patient's history has been reviewed, patient examined, no change in status, stable for surgery.  I have reviewed the patient's chart and labs.  Questions were answered to the patient's satisfaction.     Robert Owen

## 2015-06-20 NOTE — Anesthesia Preprocedure Evaluation (Addendum)
Anesthesia Evaluation  Patient identified by MRN, date of birth, ID band Patient awake    Reviewed: Allergy & Precautions, NPO status , Patient's Chart, lab work & pertinent test results  Airway Mallampati: II  TM Distance: >3 FB Neck ROM: Full    Dental no notable dental hx.    Pulmonary neg pulmonary ROS,    Pulmonary exam normal breath sounds clear to auscultation       Cardiovascular hypertension, Pt. on home beta blockers and Pt. on medications (-) angina+ CAD, + Past MI (non-ST elevation infarct in 2013) and + Cardiac Stents (DES to RCA)  Normal cardiovascular exam Rhythm:Regular Rate:Normal  a. S/P NSTEMI 10/20/11: LHC at [facility] in [place], [place]: mRCA 70% and 80% hazy, dRCA 90%, EF 65%; >> PCI: Promus DES x 2 (3x38 mm and 3x12 mm) to mid and distal RCA; b. LHC (6/14): Mid LAD 40-50, ostial circumflex 30-40, mid and distal RCA stents patent, proximal PDA 40, EF 55%   Neuro/Psych C6 radiculopathy  negative psych ROS   GI/Hepatic negative GI ROS, Neg liver ROS,   Endo/Other  negative endocrine ROS  Renal/GU negative Renal ROS  negative genitourinary   Musculoskeletal  (+) Arthritis , Osteoarthritis,    Abdominal   Peds negative pediatric ROS (+)  Hematology negative hematology ROS (+)   Anesthesia Other Findings   Reproductive/Obstetrics negative OB ROS                          Anesthesia Physical Anesthesia Plan  ASA: III  Anesthesia Plan: General   Post-op Pain Management:    Induction: Intravenous  Airway Management Planned: LMA  Additional Equipment:   Intra-op Plan:   Post-operative Plan: Extubation in OR  Informed Consent: I have reviewed the patients History and Physical, chart, labs and discussed the procedure including the risks, benefits and alternatives for the proposed anesthesia with the patient or authorized representative who has  indicated his/her understanding and acceptance.   Dental advisory given  Plan Discussed with: CRNA and Surgeon  Anesthesia Plan Comments:         Anesthesia Quick Evaluation

## 2015-06-20 NOTE — Anesthesia Procedure Notes (Addendum)
Anesthesia Regional Block:  TAP block  Pre-Anesthetic Checklist: ,, timeout performed, Correct Patient, Correct Site, Correct Laterality, Correct Procedure, Correct Position, site marked, Risks and benefits discussed,  Surgical consent,  Pre-op evaluation,  At surgeon's request and post-op pain management  Laterality: Left  Prep: chloraprep       Needles:  Injection technique: Single-shot  Needle Type: Echogenic Needle     Needle Length: 9cm 9 cm Needle Gauge: 21 and 21 G    Additional Needles:  Procedures: ultrasound guided (picture in chart) TAP block Narrative:  Injection made incrementally with aspirations every 5 mL.  Performed by: Personally   Additional Notes: Patient tolerated the procedure well without complications   Procedure Name: LMA Insertion Date/Time: 06/20/2015 9:40 AM Performed by: Toula Moos L Pre-anesthesia Checklist: Patient identified, Emergency Drugs available, Suction available, Patient being monitored and Timeout performed Patient Re-evaluated:Patient Re-evaluated prior to inductionOxygen Delivery Method: Circle System Utilized Preoxygenation: Pre-oxygenation with 100% oxygen Intubation Type: IV induction Ventilation: Mask ventilation without difficulty LMA: LMA inserted LMA Size: 5.0 Number of attempts: 1 Airway Equipment and Method: Bite block Placement Confirmation: positive ETCO2 Tube secured with: Tape Dental Injury: Teeth and Oropharynx as per pre-operative assessment

## 2015-06-20 NOTE — Transfer of Care (Signed)
Immediate Anesthesia Transfer of Care Note  Patient: Robert Limes, MD  Procedure(s) Performed: Procedure(s): LEFT INGUINAL HERNIA REPAIR WITH MESH (Left) INSERTION OF MESH (Left)  Patient Location: PACU  Anesthesia Type:GA combined with regional for post-op pain  Level of Consciousness: sedated  Airway & Oxygen Therapy: Patient Spontanous Breathing and Patient connected to face mask oxygen  Post-op Assessment: Report given to RN and Post -op Vital signs reviewed and stable  Post vital signs: Reviewed and stable  Last Vitals:  Filed Vitals:   06/20/15 0910 06/20/15 1033  BP:  110/60  Pulse: 60   Temp:    Resp: 11     Complications: No apparent anesthesia complications

## 2015-06-20 NOTE — Anesthesia Postprocedure Evaluation (Signed)
Anesthesia Post Note  Patient: Robert Limes, MD  Procedure(s) Performed: Procedure(s) (LRB): LEFT INGUINAL HERNIA REPAIR WITH MESH (Left) INSERTION OF MESH (Left)  Patient location during evaluation: PACU Anesthesia Type: General and Regional Level of consciousness: awake and alert Pain management: pain level controlled Vital Signs Assessment: post-procedure vital signs reviewed and stable Respiratory status: spontaneous breathing and respiratory function stable Cardiovascular status: blood pressure returned to baseline and stable Postop Assessment: no signs of nausea or vomiting Anesthetic complications: no    Last Vitals:  Filed Vitals:   06/20/15 1100 06/20/15 1115  BP: 132/78 154/83  Pulse: 61 65  Temp:    Resp: 18 15    Last Pain:  Filed Vitals:   06/20/15 1120  PainSc: 3                  Jared Cahn S

## 2015-06-20 NOTE — Discharge Instructions (Signed)
[facility] ° °UMBILICAL OR INGUINAL HERNIA REPAIR: POST OP INSTRUCTIONS ° °Always review your discharge instruction sheet given to you by the facility where your surgery was performed. °IF YOU HAVE DISABILITY OR FAMILY LEAVE FORMS, YOU MUST BRING THEM TO THE OFFICE FOR PROCESSING.   °DO NOT GIVE THEM TO YOUR DOCTOR. ° °1. A  prescription for pain medication may be given to you upon discharge.  Take your pain medication as prescribed, if needed.  If narcotic pain medicine is not needed, then you may take acetaminophen (Tylenol), naprosyn (Alleve) or ibuprofen (Advil) as needed. °2. Take your usually prescribed medications unless otherwise directed. °3. If you need a refill on your pain medication, please contact your pharmacy.  They will contact our office to request authorization. Prescriptions will not be filled after 5 pm or on week-ends. °4. You should follow a light diet the first 24 hours after arrival home, such as soup and crackers, etc.  Be sure to include lots of fluids daily.  Resume your normal diet the day after surgery. °5. Most patients will experience some swelling and bruising around the umbilicus or in the groin and scrotum.  Ice packs and reclining will help.  Swelling and bruising can take several days to resolve.  °6. It is common to experience some constipation if taking pain medication after surgery.  Increasing fluid intake and taking a stool softener (such as Colace) will usually help or prevent this problem from occurring.  A mild laxative (Milk of Magnesia or Miralax) should be taken according to package directions if there are no bowel movements after 48 hours. °7. Unless discharge instructions indicate otherwise, you may remove your bandages 48 hours after surgery, and you may shower at that time.  You may have steri-strips (small skin tapes) in place directly over the incision.  These strips should be left on the skin for 7-10 days and will come off on their own.   If your surgeon used skin glue on the incision, you may shower in 24 hours.  The glue will flake off over the next 2-3 weeks.  Any sutures or staples will be removed at the office during your follow-up visit. °8. ACTIVITIES:  You may resume regular (light) daily activities beginning the next day--such as daily self-care, walking, climbing stairs--gradually increasing activities as tolerated.  You may have sexual intercourse when it is comfortable.  Refrain from any heavy lifting or straining until approved by your doctor. °a. You may drive when you are no longer taking prescription pain medication, you can comfortably wear a seatbelt, and you can safely maneuver your car and apply brakes. °b. RETURN TO WORK:  __________________________________________________________ °9. You should see your doctor in the office for a follow-up appointment approximately 2-3 weeks after your surgery.  Make sure that you call for this appointment within a day or two after you arrive home to insure a convenient appointment time. °10. OTHER INSTRUCTIONS:  __________________________________________________________________________________________________________________________________________________________________________________________  °WHEN TO CALL YOUR DOCTOR: °1. Fever over 101.0 °2. Inability to urinate °3. Nausea and/or vomiting °4. Extreme swelling or bruising °5. Continued bleeding from incision. °6. Increased pain, redness, or drainage from the incision ° °The clinic staff is available to answer your questions during regular business hours.  Please don’t hesitate to call and ask to speak to one of the nurses for clinical concerns.  If you have a medical emergency, go to the nearest emergency room or call 911.  A surgeon from [facility]   is always on call at the hospital ° ° °[address], [address], [place], [place]  [address] ? ° [address] [address], , [place]   [address] °[phone] ? [phone] ? FAX  [phone] °Web site: [email or web address] ° ° °Post Anesthesia Home Care Instructions ° °Activity: °Get plenty of rest for the remainder of the day. A responsible adult should stay with you for 24 hours following the procedure.  °For the next 24 hours, DO NOT: °-Drive a car °-Operate machinery °-Drink alcoholic beverages °-Take any medication unless instructed by your physician °-Make any legal decisions or sign important papers. ° °Meals: °Start with liquid foods such as gelatin or soup. Progress to regular foods as tolerated. Avoid greasy, spicy, heavy foods. If nausea and/or vomiting occur, drink only clear liquids until the nausea and/or vomiting subsides. Call your physician if vomiting continues. ° °Special Instructions/Symptoms: °Your throat may feel dry or sore from the anesthesia or the breathing tube placed in your throat during surgery. If this causes discomfort, gargle with warm salt water. The discomfort should disappear within 24 hours. ° °If you had a scopolamine patch placed behind your ear for the management of post- operative nausea and/or vomiting: ° °1. The medication in the patch is effective for 72 hours, after which it should be removed.  Wrap patch in a tissue and discard in the trash. Wash hands thoroughly with soap and water. °2. You may remove the patch earlier than 72 hours if you experience unpleasant side effects which may include dry mouth, dizziness or visual disturbances. °3. Avoid touching the patch. Wash your hands with soap and water after contact with the patch. °  ° °

## 2015-06-20 NOTE — Op Note (Signed)
Preoperative diagnosis: Right inguinal hernia Postoperative diagnosis: Direct right inguinal hernia Procedure: Right inguinal hernia repair with Ultrapro hernia system Surgeon: Dr. Serita Grammes Anesthesia: Gen.with tap block Estimated blood loss: Minimal Complications: None Drains: None Specimens: None Sponge and needle count was correct   Indications: This is a healthy 48 yom who has a symptomatic right groin hernia. We discussed proceeding to the operating room.  Procedure: After informed consent was obtained the patient was taken to the operating room. He was given antibiotics. Sequential compression devices were on his legs. He was then placed under general anesthesia without complication. His right groin was prepped and draped in the standard sterile surgical fashion. A surgical timeout was then performed.  I injected Marcaine in the right groin as well as performed a tap block. I then made a right groin incision. I was able to identify as inferior epigastric vein and ligated this with 3-0 vicryl. I then entered the external oblique through the external ring. I encircled his cord. He had a large direct hernia and no indirect hernia. I was able to identify all of this cord structures. I entered into the direct space and reduced the hernia. I then inserted an ultrapro hernia system.  I closed the floor. I then laid the mesh flat. I sutured this into position to the pubic tubercle several times with a 2-0 Vicryl suture. I made a T cut and wrapped this around the spermatic cord. I secured this every half centimeter to the inguinal ligament below. I laid the lateral portion flat. I sewed the cut ends of the mesh together as well as to the internal oblique. There was no evidence of any additional hernia and this completely completely covered the defect. I then obtained hemostasis. I then closed the external oblique with 2-0 Vicryl. The Scarpa's fascia was closed with 3-0 Vicryl. The skin was closed  4-0 Monocryl. I then placed glue on the the incision. He tolerated this well was extubated and transferred to recovery stable.

## 2015-06-21 ENCOUNTER — Encounter (HOSPITAL_BASED_OUTPATIENT_CLINIC_OR_DEPARTMENT_OTHER): Payer: Self-pay | Admitting: General Surgery

## 2015-06-30 DIAGNOSIS — H524 Presbyopia: Secondary | ICD-10-CM | POA: Diagnosis not present

## 2015-06-30 DIAGNOSIS — H26492 Other secondary cataract, left eye: Secondary | ICD-10-CM | POA: Diagnosis not present

## 2015-07-04 MED FILL — ROSUVASTATIN CALCIUM 40 MG: 40 | 90 days supply | Qty: 90 | Fill #0

## 2015-07-18 MED FILL — METOPROLOL TARTRATE 25 MG T: 25 | 90 days supply | Qty: 180 | Fill #0

## 2015-07-28 ENCOUNTER — Other Ambulatory Visit: Payer: Self-pay | Admitting: Family Medicine

## 2015-07-28 MED ORDER — LORAZEPAM 1 MG PO TABS: 1.0000 mg | ORAL_TABLET | Freq: Two times a day (BID) | ORAL | Status: DC | PRN

## 2015-07-28 MED ORDER — ZOLPIDEM TARTRATE 5 MG PO TABS: 5.0000 mg | ORAL_TABLET | Freq: Every evening | ORAL | Status: DC | PRN

## 2015-07-28 MED ORDER — HYDROCODONE-HOMATROPINE 5-1.5 MG/5ML PO SYRP: 5.0000 mL | ORAL_SOLUTION | Freq: Three times a day (TID) | ORAL | Status: DC | PRN

## 2015-07-29 MED FILL — ZOLPIDEM TARTRATE 5 MG TAB: 5 | 15 days supply | Qty: 15 | Fill #0

## 2015-07-29 MED FILL — HYDROCODONE-HOMATROPINE SYR: 5-1.5 | 16 days supply | Qty: 240 | Fill #0

## 2015-09-29 DIAGNOSIS — M50123 Cervical disc disorder at C6-C7 level with radiculopathy: Secondary | ICD-10-CM | POA: Diagnosis not present

## 2015-09-29 DIAGNOSIS — E784 Other hyperlipidemia: Secondary | ICD-10-CM | POA: Diagnosis not present

## 2015-09-29 DIAGNOSIS — Z125 Encounter for screening for malignant neoplasm of prostate: Secondary | ICD-10-CM | POA: Diagnosis not present

## 2015-09-29 DIAGNOSIS — Z1389 Encounter for screening for other disorder: Secondary | ICD-10-CM | POA: Diagnosis not present

## 2015-09-29 DIAGNOSIS — I252 Old myocardial infarction: Secondary | ICD-10-CM | POA: Diagnosis not present

## 2015-09-29 DIAGNOSIS — I10 Essential (primary) hypertension: Secondary | ICD-10-CM | POA: Diagnosis not present

## 2015-09-29 DIAGNOSIS — R7301 Impaired fasting glucose: Secondary | ICD-10-CM | POA: Diagnosis not present

## 2015-09-29 DIAGNOSIS — Z86010 Personal history of colonic polyps: Secondary | ICD-10-CM | POA: Diagnosis not present

## 2015-09-29 DIAGNOSIS — Z6826 Body mass index (BMI) 26.0-26.9, adult: Secondary | ICD-10-CM | POA: Diagnosis not present

## 2015-09-29 DIAGNOSIS — Z0000 Encounter for general adult medical examination without abnormal findings: Secondary | ICD-10-CM | POA: Diagnosis not present

## 2015-09-29 DIAGNOSIS — Z9861 Coronary angioplasty status: Secondary | ICD-10-CM | POA: Diagnosis not present

## 2015-09-29 MED FILL — METOPROLOL TARTRATE 25 MG T: 25 | 45 days supply | Qty: 90 | Fill #0

## 2015-09-29 MED FILL — NITROGLYCERIN 0.4 MG TAB SL: 0.4 | 15 days supply | Qty: 25 | Fill #0

## 2015-09-29 MED FILL — ROSUVASTATIN CALCIUM 40 MG: 40 | 90 days supply | Qty: 90 | Fill #0

## 2015-11-16 MED FILL — METOPROLOL TARTRATE 25 MG T: 25 | 90 days supply | Qty: 180 | Fill #1

## 2015-12-26 DIAGNOSIS — I10 Essential (primary) hypertension: Secondary | ICD-10-CM | POA: Diagnosis not present

## 2015-12-26 DIAGNOSIS — R8299 Other abnormal findings in urine: Secondary | ICD-10-CM | POA: Diagnosis not present

## 2015-12-26 DIAGNOSIS — R3129 Other microscopic hematuria: Secondary | ICD-10-CM | POA: Diagnosis not present

## 2015-12-26 DIAGNOSIS — Z6826 Body mass index (BMI) 26.0-26.9, adult: Secondary | ICD-10-CM | POA: Diagnosis not present

## 2015-12-26 DIAGNOSIS — R310 Gross hematuria: Secondary | ICD-10-CM | POA: Diagnosis not present

## 2015-12-28 DIAGNOSIS — R300 Dysuria: Secondary | ICD-10-CM | POA: Diagnosis not present

## 2015-12-28 DIAGNOSIS — N390 Urinary tract infection, site not specified: Secondary | ICD-10-CM | POA: Diagnosis not present

## 2015-12-28 MED FILL — CIPROFLOXACIN HCL 500 MG TA: 500 | 7 days supply | Qty: 14 | Fill #0

## 2016-01-02 DIAGNOSIS — R310 Gross hematuria: Secondary | ICD-10-CM | POA: Diagnosis not present

## 2016-01-02 DIAGNOSIS — N3281 Overactive bladder: Secondary | ICD-10-CM | POA: Diagnosis not present

## 2016-01-04 MED FILL — ROSUVASTATIN CALCIUM 40 MG: 40 | 90 days supply | Qty: 90 | Fill #1

## 2016-01-10 DIAGNOSIS — R310 Gross hematuria: Secondary | ICD-10-CM | POA: Diagnosis not present

## 2016-01-23 DIAGNOSIS — R310 Gross hematuria: Secondary | ICD-10-CM | POA: Diagnosis not present

## 2016-01-23 DIAGNOSIS — N281 Cyst of kidney, acquired: Secondary | ICD-10-CM | POA: Diagnosis not present

## 2016-01-23 DIAGNOSIS — N323 Diverticulum of bladder: Secondary | ICD-10-CM | POA: Diagnosis not present

## 2016-01-24 ENCOUNTER — Other Ambulatory Visit: Payer: Self-pay | Admitting: Urology

## 2016-02-14 ENCOUNTER — Encounter (HOSPITAL_BASED_OUTPATIENT_CLINIC_OR_DEPARTMENT_OTHER): Payer: Self-pay | Admitting: *Deleted

## 2016-02-14 NOTE — Progress Notes (Signed)
NPO AFTER MN.  ARRIVE AT 0600.  NEEDS ISTAT.  CURRENT EKG IN CHART AND EPIC.  WILL TAKE METOPROLOL AM DOS W/ SIPS OF WATER.

## 2016-02-15 NOTE — H&P (Signed)
Robert Owen is a 70 year-old male established patient who has experienced gross hematuria.  He first noticed the symptoms approximately 12/15/2015. He did see the blood in his urine. He has seen blood clots.   He does have a burning sensation when he urinates. He is not currently having trouble urinating.   He has not had kidney stones. He is not having pain.   Interval history: He reports no further hematuria or other new urologic complaints.     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 I will obtain a BUN and creatinine today, he will be scheduled for a CT scan of the abdomen and pelvis with and without contrast using hematuria protocol and then will return for completion of his hematuria workup with cystoscopy. - 01/02/2016 Overactive bladder, He has symptoms of bladder overactivity with a forceful stream and therefore we discussed a trial of Myrbetriq 25 mg. He's going to give this a try and report back to me if it was effective for him. 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 He was seen and evaluated and found to have 5-10 rbc/hpf on urinalysis on 12/26/15. He indicated that he had a similar episode of hematuria in 2015 which resolved spontaneously  without evaluation. He has not had any injury, has never smoked and has not had any significant exposure to industrial chemicals.   A follow-up urinalysis on 12/28/15 had too numerous to count white blood cells and 10-20 RBCs. A urine culture was performed and found to be negative. He had reported progressive dysuria and was placed on empiric Cipro. He has no history of bleeding dyscrasias with no history of epistaxis, hemoptysis, melena or easy bruising.   He has known BPH with some frequency, urgency and nocturia. He says that occasionally if he is unable to get to the bathroom in time he will have a small amount of incontinence. His nocturia is 2-4 times. The symptoms have been present for some time. They are enough of a bother that he would consider A trial of pharmacologic therapy.   He does report having suboptimal erections with decreased rigidity.     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  Drinks 2 drinks per day. Light Drinker.  Drinks 2 caffeinated drinks per day. 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    REVIEW OF SYSTEMS:    GU Review Male:   Patient reports hard to postpone urination, leakage of urine,  erection problems, get up at night to urinate, and frequent urination. Patient denies have to strain to urinate , trouble starting your stream, penile pain, burning/ pain with urination, and stream starts and stops.  Gastrointestinal (Upper):   Patient denies nausea, vomiting, and indigestion/ heartburn.  Gastrointestinal (Lower):   Patient denies diarrhea  and constipation.  Constitutional:   Patient denies fever, night sweats, weight loss, and fatigue.  Skin:   Patient denies skin rash/ lesion and itching.  Eyes:   Patient denies blurred vision and double vision.  Ears/ Nose/ Throat:   Patient denies sore throat and sinus problems.  Hematologic/Lymphatic:   Patient denies swollen glands and easy bruising.  Cardiovascular:   Patient denies leg swelling and chest pains.  Respiratory:   Patient denies cough and shortness of breath.  Endocrine:   Patient denies excessive thirst.  Musculoskeletal:   Patient denies back pain and joint pain.  Neurological:   Patient denies headaches and dizziness.  Psychologic:   Patient denies depression and anxiety.   VITAL SIGNS:       BP 144/65 mmHg  Pulse 59 /min   GU PHYSICAL EXAMINATION:    Urethral Meatus: Normal size. No lesion, no wart, no discharge, no polyp. Normal location.  Penis: Circumcised, no foreskin warts, no cracks. No dorsal peyronie's plaques, no left corporal peyronie's plaques, no right corporal peyronie's plaques, no scarring, no shaft warts. No balanitis, no meatal stenosis.    MULTI-SYSTEM PHYSICAL EXAMINATION:    Constitutional: Well-nourished. No physical deformities. Normally developed. Good grooming.  Neck: Neck symmetrical, not swollen. Normal tracheal position.  Respiratory: No labored breathing, no use of accessory muscles.   Cardiovascular: Normal temperature, normal extremity pulses, no swelling, no varicosities.  Lymphatic: No enlargement of neck, axillae, groin.  Skin: No paleness, no jaundice, no cyanosis. No lesion, no ulcer, no rash.  Neurologic / Psychiatric: Oriented to time, oriented to place, oriented to person. No depression, no anxiety, no agitation.  Gastrointestinal: No mass, no tenderness, no rigidity, non obese abdomen.  Eyes: Normal conjunctivae. Normal eyelids.  Ears, Nose, Mouth, and Throat: Left ear no scars, no lesions, no masses. Right ear no scars, no  lesions, no masses. Nose no scars, no lesions, no masses. Normal hearing. Normal lips.  Musculoskeletal: Normal gait and station of head and neck.  Hand but we upstairs in the file folder of the needle to the right were all vitals are there is one that says HSA and it's it's little bit is on they're clipped to the inside of $1000   PAST DATA REVIEWED:  Source Of History:  Patient, Outside Source  Records Review:   Previous Patient Records  X-Ray Review: C.T. Abdomen/Pelvis: Reviewed Films. Reviewed Report. Discussed With Patient. He was found have a normal upper tract With a moderate sized diverticulum involving the left side of the bladder. 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   Notes:                     I went over the results of his CT scan with him and gave him a copy of the report. It revealed no abnormality of the upper tract with that appeared to be a small simple cyst of the right kidney. He also was noted to have a moderate sized diverticulum involving the left gallbladder with, on CT scan, there appeared to be a filling defect.   PROCEDURES:         Flexible Cystoscopy done on 01/23/16 Risks, benefits,  and some of the potential complications of the procedure were discussed at length with the patient including infection, bleeding, voiding discomfort, urinary retention, fever, chills, sepsis, and others. All questions were answered. Informed consent was obtained. Sterile technique and 2% Lidocaine intraurethral analgesia were used.  Meatus:  Normal size. Normal location. Normal condition.  Urethra:  No strictures.  External Sphincter:  Normal.  Verumontanum:  Normal.  Prostate:  Borderline obstructing. Moderate hyperplasia.  Bladder Neck:  Non-obstructing.  Ureteral Orifices:  Normal location. Normal size. Normal shape. Effluxed clear urine.  Bladder:  Moderate trabeculation. No tumors. Normal mucosa. No stones.He had several pseudodiverticuli and the 1 diverticulum that was  seen on his CT scan, that appears to be a Hutch diverticulum, was noted on the left wall of his bladder but I was unable to negotiate the flexible scope into the diverticulum in order to see it's the entire surface.      The lower urinary tract was carefully examined. The procedure was well-tolerated and without complications. Instructions were given to call the office immediately for bloody urine, difficulty urinating, urinary retention, painful or frequent urination, fever or other illness. The patient stated that he understood these instructions and would comply with them.         Urinalysis w/Scope - 81001 Dipstick Dipstick Cont'd Micro  Specimen: Voided Bilirubin: Neg WBC/hpf: NS (Not Seen)  Color: Yellow Ketones: Neg RBC/hpf: 0-2/hpf  Appearance: Clear Blood: 3+ Bacteria: Rare  Specific Gravity: 1.025 Protein: Neg Cystals: NS (Not Seen)  pH: 5.5 Urobilinogen: 0.2 Casts: NS (Not Seen)  Glucose: Neg Nitrites: Neg Trichomonas: Not Present    Leukocyte Esterase: Neg Mucous: Present      Epithelial Cells: NS (Not Seen)      Yeast: NS (Not Seen)      Sperm: Not Present    ASSESSMENT:      ICD-10 Details  1 GU:   Gross hematuria - R31.0 Stable - He has not seen any further gross hematuria.  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 We discussed the typical muscular backing of the bladder wall and the fact that the diverticulum has a very thin wall and the need to fully evaluate this cystoscopically to be absolutely sure that there was no urothelial tumors within the diverticulum. Because I was unsuccessful today using the flexible cystoscope I've recommended performing this under anesthesia as an outpatient and using either the flexible cystoscope, the rigid cystoscope or possibly even the flexible ureteroscope in order to fully visualize the entire  inside surface of his diverticulum. We discussed possible need for biopsy if a lesion is identified.  3   Renal Cysts, Simple - N28.1 Right, We discussed the sub-centimeter area noted within the right kidney that appears to be a simple cyst.   PLAN: He is scheduled for cystoscopy under anesthesia with possible biopsy.

## 2016-02-17 ENCOUNTER — Ambulatory Visit (HOSPITAL_BASED_OUTPATIENT_CLINIC_OR_DEPARTMENT_OTHER): Payer: PPO | Admitting: Anesthesiology

## 2016-02-17 ENCOUNTER — Encounter (HOSPITAL_BASED_OUTPATIENT_CLINIC_OR_DEPARTMENT_OTHER)
Admission: RE | Disposition: A | Discharge status: Home / Self Care | Payer: Self-pay | Source: Ambulatory Visit | Attending: Urology

## 2016-02-17 ENCOUNTER — Ambulatory Visit (HOSPITAL_BASED_OUTPATIENT_CLINIC_OR_DEPARTMENT_OTHER)
Admission: RE | Admit: 2016-02-17 | Discharge: 2016-02-17 | Disposition: A | Discharge status: Home / Self Care | Payer: PPO | Source: Ambulatory Visit | Attending: Urology | Admitting: Urology

## 2016-02-17 ENCOUNTER — Encounter (HOSPITAL_BASED_OUTPATIENT_CLINIC_OR_DEPARTMENT_OTHER): Payer: Self-pay

## 2016-02-17 DIAGNOSIS — N3281 Overactive bladder: Secondary | ICD-10-CM | POA: Diagnosis not present

## 2016-02-17 DIAGNOSIS — Z955 Presence of coronary angioplasty implant and graft: Secondary | ICD-10-CM | POA: Diagnosis not present

## 2016-02-17 DIAGNOSIS — I252 Old myocardial infarction: Secondary | ICD-10-CM | POA: Diagnosis not present

## 2016-02-17 DIAGNOSIS — N400 Enlarged prostate without lower urinary tract symptoms: Secondary | ICD-10-CM | POA: Diagnosis not present

## 2016-02-17 DIAGNOSIS — Z79899 Other long term (current) drug therapy: Secondary | ICD-10-CM | POA: Diagnosis not present

## 2016-02-17 DIAGNOSIS — N323 Diverticulum of bladder: Secondary | ICD-10-CM | POA: Insufficient documentation

## 2016-02-17 DIAGNOSIS — R310 Gross hematuria: Secondary | ICD-10-CM

## 2016-02-17 DIAGNOSIS — I10 Essential (primary) hypertension: Secondary | ICD-10-CM | POA: Insufficient documentation

## 2016-02-17 DIAGNOSIS — I2510 Atherosclerotic heart disease of native coronary artery without angina pectoris: Secondary | ICD-10-CM | POA: Diagnosis not present

## 2016-02-17 DIAGNOSIS — E7800 Pure hypercholesterolemia, unspecified: Secondary | ICD-10-CM | POA: Insufficient documentation

## 2016-02-17 DIAGNOSIS — Z7982 Long term (current) use of aspirin: Secondary | ICD-10-CM | POA: Diagnosis not present

## 2016-02-17 DIAGNOSIS — N281 Cyst of kidney, acquired: Secondary | ICD-10-CM | POA: Diagnosis not present

## 2016-02-17 HISTORY — DX: Unspecified osteoarthritis, unspecified site: M19.90

## 2016-02-17 HISTORY — DX: Other seasonal allergic rhinitis: J30.2

## 2016-02-17 HISTORY — DX: Gross hematuria: R31.0

## 2016-02-17 HISTORY — DX: Radiculopathy, cervical region: M54.12

## 2016-02-17 HISTORY — DX: Cardiac murmur, unspecified: R01.1

## 2016-02-17 HISTORY — DX: Presence of coronary angioplasty implant and graft: Z95.5

## 2016-02-17 HISTORY — DX: Urgency of urination: R39.15

## 2016-02-17 HISTORY — DX: Personal history of colonic polyps: Z86.010

## 2016-02-17 HISTORY — DX: Personal history of adenomatous and serrated colon polyps: Z86.0101

## 2016-02-17 HISTORY — PX: CYSTOSCOPY WITH FULGERATION: SHX6638

## 2016-02-17 HISTORY — DX: Old myocardial infarction: I25.2

## 2016-02-17 HISTORY — DX: Diverticulum of bladder: N32.3

## 2016-02-17 LAB — POCT I-STAT, CHEM 8
BUN: 17 mg/dL (ref 6–20)
Calcium, Ion: 1.23 mmol/L (ref 1.15–1.40)
Chloride: 102 mmol/L (ref 101–111)
Creatinine, Ser: 0.8 mg/dL (ref 0.61–1.24)
Glucose, Bld: 109 mg/dL — ABNORMAL HIGH (ref 65–99)
HCT: 43 % (ref 39.0–52.0)
Hemoglobin: 14.6 g/dL (ref 13.0–17.0)
Potassium: 3.9 mmol/L (ref 3.5–5.1)
Sodium: 141 mmol/L (ref 135–145)
TCO2: 25 mmol/L (ref 0–100)

## 2016-02-17 SURGERY — CYSTOSCOPY, WITH BLADDER FULGURATION
Anesthesia: General

## 2016-02-17 MED ORDER — MEPERIDINE HCL 25 MG/ML IJ SOLN
6.2500 mg | INTRAMUSCULAR | Status: DC | PRN
  Filled 2016-02-17: qty 1

## 2016-02-17 MED ORDER — DEXAMETHASONE SODIUM PHOSPHATE 4 MG/ML IJ SOLN
INTRAMUSCULAR | Status: DC | PRN
  Administered 2016-02-17: 10 mg via INTRAVENOUS

## 2016-02-17 MED ORDER — OXYCODONE HCL 5 MG/5ML PO SOLN
5.0000 mg | Freq: Once | ORAL | Status: DC | PRN
  Filled 2016-02-17: qty 5

## 2016-02-17 MED ORDER — FENTANYL CITRATE (PF) 100 MCG/2ML IJ SOLN
INTRAMUSCULAR | Status: AC
  Filled 2016-02-17: qty 2

## 2016-02-17 MED ORDER — PROPOFOL 10 MG/ML IV BOLUS
INTRAVENOUS | Status: AC
  Filled 2016-02-17: qty 20

## 2016-02-17 MED ORDER — PHENAZOPYRIDINE HCL 200 MG PO TABS
200.0000 mg | ORAL_TABLET | Freq: Once | ORAL | Status: AC
  Administered 2016-02-17: 200 mg via ORAL
  Filled 2016-02-17: qty 1

## 2016-02-17 MED ORDER — CIPROFLOXACIN IN D5W 200 MG/100ML IV SOLN
INTRAVENOUS | Status: AC
  Filled 2016-02-17: qty 100

## 2016-02-17 MED ORDER — DEXAMETHASONE SODIUM PHOSPHATE 10 MG/ML IJ SOLN
INTRAMUSCULAR | Status: AC
  Filled 2016-02-17: qty 1

## 2016-02-17 MED ORDER — LIDOCAINE 2% (20 MG/ML) 5 ML SYRINGE
INTRAMUSCULAR | Status: DC | PRN
  Administered 2016-02-17: 80 mg via INTRAVENOUS

## 2016-02-17 MED ORDER — PROPOFOL 10 MG/ML IV BOLUS
INTRAVENOUS | Status: DC | PRN
  Administered 2016-02-17: 150 mg via INTRAVENOUS

## 2016-02-17 MED ORDER — ONDANSETRON HCL 4 MG/2ML IJ SOLN
INTRAMUSCULAR | Status: AC
  Filled 2016-02-17: qty 2

## 2016-02-17 MED ORDER — MIDAZOLAM HCL 2 MG/2ML IJ SOLN
INTRAMUSCULAR | Status: AC
  Filled 2016-02-17: qty 2

## 2016-02-17 MED ORDER — ONDANSETRON HCL 4 MG/2ML IJ SOLN
INTRAMUSCULAR | Status: DC | PRN
  Administered 2016-02-17: 4 mg via INTRAVENOUS

## 2016-02-17 MED ORDER — ARTIFICIAL TEARS OP OINT
TOPICAL_OINTMENT | OPHTHALMIC | Status: AC
Start: 2016-02-17 — End: 2016-02-17
  Filled 2016-02-17: qty 7

## 2016-02-17 MED ORDER — LACTATED RINGERS IV SOLN
INTRAVENOUS | Status: DC
  Administered 2016-02-17: 07:00:00 via INTRAVENOUS
  Filled 2016-02-17: qty 1000

## 2016-02-17 MED ORDER — HYDROMORPHONE HCL 1 MG/ML IJ SOLN
0.2500 mg | INTRAMUSCULAR | Status: DC | PRN
  Filled 2016-02-17: qty 1

## 2016-02-17 MED ORDER — CIPROFLOXACIN IN D5W 200 MG/100ML IV SOLN
200.0000 mg | INTRAVENOUS | Status: AC
  Administered 2016-02-17: 200 mg via INTRAVENOUS
  Filled 2016-02-17: qty 100

## 2016-02-17 MED ORDER — MIDAZOLAM HCL 5 MG/5ML IJ SOLN
INTRAMUSCULAR | Status: DC | PRN
  Administered 2016-02-17: 2 mg via INTRAVENOUS

## 2016-02-17 MED ORDER — FENTANYL CITRATE (PF) 100 MCG/2ML IJ SOLN
INTRAMUSCULAR | Status: DC | PRN
  Administered 2016-02-17: 50 ug via INTRAVENOUS

## 2016-02-17 MED ORDER — ONDANSETRON HCL 4 MG/2ML IJ SOLN
4.0000 mg | Freq: Once | INTRAMUSCULAR | Status: DC | PRN
  Filled 2016-02-17: qty 2

## 2016-02-17 MED ORDER — LIDOCAINE HCL 2 % EX GEL
CUTANEOUS | Status: DC | PRN
  Administered 2016-02-17: 1 via URETHRAL

## 2016-02-17 MED ORDER — LACTATED RINGERS IV SOLN
INTRAVENOUS | Status: DC | PRN
  Administered 2016-02-17: 07:00:00 via INTRAVENOUS

## 2016-02-17 MED ORDER — PHENAZOPYRIDINE HCL 100 MG PO TABS
ORAL_TABLET | ORAL | Status: AC
  Filled 2016-02-17: qty 2

## 2016-02-17 MED ORDER — OXYCODONE HCL 5 MG PO TABS
5.0000 mg | ORAL_TABLET | Freq: Once | ORAL | Status: DC | PRN
  Filled 2016-02-17: qty 1

## 2016-02-17 MED ORDER — PHENAZOPYRIDINE HCL 200 MG PO TABS: 200.0000 mg | ORAL_TABLET | Freq: Three times a day (TID) | ORAL | 0 refills | Status: DC | PRN

## 2016-02-17 MED ORDER — STERILE WATER FOR IRRIGATION IR SOLN
Status: DC | PRN
  Administered 2016-02-17: 3000 mL via INTRAVESICAL

## 2016-02-17 MED FILL — PHENAZOPYRIDINE 200 MG TAB: 200 | 3 days supply | Qty: 8 | Fill #0

## 2016-02-17 SURGICAL SUPPLY — 18 items
BAG DRAIN URO-CYSTO SKYTR STRL (DRAIN) ×2 IMPLANT
BAG DRN UROCATH (DRAIN) ×1
CLOTH BEACON ORANGE TIMEOUT ST (SAFETY) ×2 IMPLANT
ELECT REM PT RETURN 9FT ADLT (ELECTROSURGICAL) ×2
ELECTRODE REM PT RTRN 9FT ADLT (ELECTROSURGICAL) ×1 IMPLANT
GLOVE BIO SURGEON STRL SZ8 (GLOVE) ×2 IMPLANT
GOWN STRL REUS W/ TWL LRG LVL3 (GOWN DISPOSABLE) ×1 IMPLANT
GOWN STRL REUS W/ TWL XL LVL3 (GOWN DISPOSABLE) ×1 IMPLANT
GOWN STRL REUS W/TWL LRG LVL3 (GOWN DISPOSABLE) ×2
GOWN STRL REUS W/TWL XL LVL3 (GOWN DISPOSABLE) ×2
IV NS IRRIG 3000ML ARTHROMATIC (IV SOLUTION) IMPLANT
KIT ROOM TURNOVER WOR (KITS) ×2 IMPLANT
MANIFOLD NEPTUNE II (INSTRUMENTS) ×1 IMPLANT
NEEDLE HYPO 22GX1.5 SAFETY (NEEDLE) IMPLANT
NS IRRIG 500ML POUR BTL (IV SOLUTION) IMPLANT
PACK CYSTO (CUSTOM PROCEDURE TRAY) ×2 IMPLANT
TUBE CONNECTING 12X1/4 (SUCTIONS) ×1 IMPLANT
WATER STERILE IRR 3000ML UROMA (IV SOLUTION) ×2 IMPLANT

## 2016-02-17 NOTE — Discharge Instructions (Signed)
°  Post Anesthesia Home Care Instructions ° °Activity: °Get plenty of rest for the remainder of the day. A responsible adult should stay with you for 24 hours following the procedure.  °For the next 24 hours, DO NOT: °-Drive a car °-Operate machinery °-Drink alcoholic beverages °-Take any medication unless instructed by your physician °-Make any legal decisions or sign important papers. ° °Meals: °Start with liquid foods such as gelatin or soup. Progress to regular foods as tolerated. Avoid greasy, spicy, heavy foods. If nausea and/or vomiting occur, drink only clear liquids until the nausea and/or vomiting subsides. Call your physician if vomiting continues. ° °Special Instructions/Symptoms: °Your throat may feel dry or sore from the anesthesia or the breathing tube placed in your throat during surgery. If this causes discomfort, gargle with warm salt water. The discomfort should disappear within 24 hours. ° °If you had a scopolamine patch placed behind your ear for the management of post- operative nausea and/or vomiting: ° °1. The medication in the patch is effective for 72 hours, after which it should be removed.  Wrap patch in a tissue and discard in the trash. Wash hands thoroughly with soap and water. °2. You may remove the patch earlier than 72 hours if you experience unpleasant side effects which may include dry mouth, dizziness or visual disturbances. °3. Avoid touching the patch. Wash your hands with soap and water after contact with the patch. °  °Cystoscopy patient instructions ° °Following a cystoscopy, a catheter (a flexible rubber tube) is sometimes left in place to empty the bladder. This may cause some discomfort or a feeling that you need to urinate. Your doctor determines the period of time that the catheter will be left in place. °You may have bloody urine for two to three days (Call your doctor if the amount of bleeding increases or does not subside). ° °You may pass blood clots in your urine,  especially if you had a biopsy. It is not unusual to pass small blood clots and have some bloody urine a couple of weeks after your cystoscopy. Again, call your doctor if the bleeding does not subside. °You may have: °Dysuria (painful urination) °Frequency (urinating often) °Urgency (strong desire to urinate) ° °These symptoms are common especially if medicine is instilled into the bladder or a ureteral stent is placed. Avoiding alcohol and caffeine, such as coffee, tea, and chocolate, may help relieve these symptoms. Drink plenty of water, unless otherwise instructed. Your doctor may also prescribe an antibiotic or other medicine to reduce these symptoms. ° °Cystoscopy results are available soon after the procedure; biopsy results usually take two to four days. Your doctor will discuss the results of your exam with you. Before you go home, you will be given specific instructions for follow-up care. °Special Instructions: ° °1  If you are going home with a catheter in place do not take a tub bath until removed by your doctor.  °2  You may resume your normal activities.  °3  Do not drive or operate machinery if you are taking narcotic pain medicine.  °4  Be sure to keep all follow-up appointments with your doctor.  ° °5 Call Your Doctor If: The catheter is not draining ° You have severe pain ° You are unable to urinate ° You have a fever over 101 ° You have severe bleeding °        ° °

## 2016-02-17 NOTE — Anesthesia Postprocedure Evaluation (Signed)
Anesthesia Post Note  Patient: [name], MD  Procedure(s) Performed: Procedure(s) (LRB): CYSTOSCOPY with fulgeration of bleeders (N/A)  Patient location during evaluation: PACU Anesthesia Type: General Level of consciousness: awake and alert Pain management: pain level controlled Vital Signs Assessment: post-procedure vital signs reviewed and stable Respiratory status: spontaneous breathing, nonlabored ventilation, respiratory function stable and patient connected to nasal cannula oxygen Cardiovascular status: blood pressure returned to baseline and stable Postop Assessment: no signs of nausea or vomiting Anesthetic complications: no    Last Vitals:  Vitals:   02/17/16 0812 02/17/16 0830  BP:    Pulse:    Resp: 12 14  Temp: 36.5 C     Last Pain:  Vitals:   02/17/16 0830  TempSrc:   PainSc: 2                  Petrice Beedy A

## 2016-02-17 NOTE — Anesthesia Procedure Notes (Signed)
Procedure Name: LMA Insertion Date/Time: 02/17/2016 7:30 AM Performed by: Wanita Chamberlain Pre-anesthesia Checklist: Patient identified, Timeout performed, Emergency Drugs available, Suction available and Patient being monitored Patient Re-evaluated:Patient Re-evaluated prior to inductionOxygen Delivery Method: Circle system utilized Preoxygenation: Pre-oxygenation with 100% oxygen Intubation Type: IV induction Ventilation: Mask ventilation without difficulty LMA: LMA inserted LMA Size: 4.0 Number of attempts: 1 Placement Confirmation: positive ETCO2 and breath sounds checked- equal and bilateral Tube secured with: Tape Dental Injury: Teeth and Oropharynx as per pre-operative assessment

## 2016-02-17 NOTE — Op Note (Signed)
PATIENT:  Robert Limes, Robert Owen  PRE-OPERATIVE DIAGNOSIS: 1. Gross hematuria. 2. Bladder diverticulum  POST-OPERATIVE DIAGNOSIS: 1. Gross hematuria likely from the prostate. 2. No evidence of bladder tumor.  PROCEDURE: 1. Cystoscopy 2. Fulguration of bleeding prostate.  SURGEON:  Claybon Jabs  INDICATION: Robert Limes, Robert Owen is a  70 year old male who experienced gross hematuria and was evaluated with a CT scan which revealed no abnormality of the upper tract to account for his hematuria. 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 I attempted to visualize the entire inside of the diverticulum but was unable to do so in my office due to discomfort and the acute angle required to visualize the diverticulum. There was a question of a possible lesion within the diverticulum on his CT scan and therefore I recommended further evaluation under anesthesia to allow me to visualize the entire diverticulum and perform biopsy of the lesion if identified.  ANESTHESIA:  General  EBL:  Minimal  DRAINS: None  LOCAL MEDICATIONS USED:  2% lidocaine jelly  SPECIMEN:  None  Description of procedure: After informed consent the patient was taken to the operating room and placed on the table in a supine position. General anesthesia was then administered. Once fully anesthetized the patient was moved to the dorsal lithotomy position and the genitalia were sterilely prepped and draped in standard fashion. An official timeout was then performed.  Initially the 23 French cystoscope was passed under direct vision down the urethra which is noted be normal. The prostatic urethra revealed bilobar hypertrophy with elongation and a moderately large median lobe component. Upon entering the bladder I again noted the ureteral orifices were of normal configuration and position. There was 1+ trabeculation. Several shallow diverticuli were identified  with no lesion. The area that I wanted to visualize on the left wall within a diverticulum was identified and I first entered this with the 30 lens but was unable to visualize all the way to the back of the diverticulum so I switched to a 70 lens and still was unable to visualize the entire inside surface of the diverticulum.  I switched to the flexible cystoscope and was able to eventually negotiate this into the diverticulum and was able to flex it and off to visualize the entire inside surface and noted no lesion within the diverticulum whatsoever. The mucosa appeared normal.  Because of his large, fairly vascular median lobe and the need to torque the scope enough to see inside the diverticulum this resulted in 2 areas of fairly brisk bleeding from the prostate. I therefore elected to use the Bugbee electrode to fulgurate these 2 areas which controlled the bleeding completely. I then emptied the bladder and remove the scope. 2% lidocaine local anesthetic was instilled in the urethra and a penile clamp was applied. The patient tolerated the procedure well with no intraoperative complications.   PLAN OF CARE: Discharge to home after PACU  PATIENT DISPOSITION:  PACU - hemodynamically stable.

## 2016-02-17 NOTE — Anesthesia Preprocedure Evaluation (Addendum)
Anesthesia Evaluation  Patient identified by MRN, date of birth, ID band Patient awake    Reviewed: Allergy & Precautions, NPO status , Patient's Chart, lab work & pertinent test results  Airway Mallampati: I  TM Distance: >3 FB Neck ROM: Full    Dental  (+) Teeth Intact, Dental Advisory Given,    Pulmonary neg pulmonary ROS,    breath sounds clear to auscultation       Cardiovascular hypertension, Pt. on medications and Pt. on home beta blockers + CAD, + Past MI and + Cardiac Stents   Rhythm:Regular Rate:Normal     Neuro/Psych Cervical Radiculopathy negative psych ROS   GI/Hepatic negative GI ROS, Neg liver ROS,   Endo/Other  negative endocrine ROS  Renal/GU Hematuria     Musculoskeletal  (+) Arthritis , Osteoarthritis,    Abdominal   Peds  Hematology negative hematology ROS (+)   Anesthesia Other Findings No angina  Reproductive/Obstetrics negative OB ROS                         Anesthesia Physical Anesthesia Plan  ASA: III  Anesthesia Plan: General   Post-op Pain Management:    Induction: Intravenous  Airway Management Planned: LMA  Additional Equipment:   Intra-op Plan:   Post-operative Plan: Extubation in OR  Informed Consent: I have reviewed the patients History and Physical, chart, labs and discussed the procedure including the risks, benefits and alternatives for the proposed anesthesia with the patient or authorized representative who has indicated his/her understanding and acceptance.   Dental advisory given  Plan Discussed with: CRNA, Anesthesiologist and Surgeon  Anesthesia Plan Comments:         Anesthesia Quick Evaluation

## 2016-02-17 NOTE — Transfer of Care (Signed)
Immediate Anesthesia Transfer of Care Note  Patient: [name], MD  Procedure(s) Performed: Procedure(s): CYSTOSCOPY with fulgeration of bleeders (N/A)  Patient Location: PACU  Anesthesia Type:General  Level of Consciousness: awake, alert , oriented and patient cooperative  Airway & Oxygen Therapy: Patient Spontanous Breathing and Patient connected to nasal cannula oxygen  Post-op Assessment: Report given to RN and Post -op Vital signs reviewed and stable  Post vital signs: Reviewed and stable  Last Vitals:  Vitals:   02/17/16 0634 02/17/16 0812  BP: 140/65   Pulse: 60   Resp: 16   Temp: 36.6 C 36.5 C    Last Pain:  Vitals:   02/17/16 0634  TempSrc: Oral      Patients Stated Pain Goal: 5 ([id number])  Complications: No apparent anesthesia complications

## 2016-02-20 ENCOUNTER — Encounter (HOSPITAL_BASED_OUTPATIENT_CLINIC_OR_DEPARTMENT_OTHER): Payer: Self-pay | Admitting: Urology

## 2016-03-01 DIAGNOSIS — M25561 Pain in right knee: Secondary | ICD-10-CM | POA: Diagnosis not present

## 2016-03-01 DIAGNOSIS — Z6827 Body mass index (BMI) 27.0-27.9, adult: Secondary | ICD-10-CM | POA: Diagnosis not present

## 2016-03-01 DIAGNOSIS — N323 Diverticulum of bladder: Secondary | ICD-10-CM | POA: Diagnosis not present

## 2016-03-01 MED FILL — NAPROXEN 500 MG TABLET: 500 | 30 days supply | Qty: 60 | Fill #0

## 2016-03-26 ENCOUNTER — Telehealth: Payer: Self-pay | Admitting: *Deleted

## 2016-03-26 NOTE — Telephone Encounter (Signed)
Lmtcb per Richardson Dopp, PA to see if pt could possibly move his appt time on 10/18 8:15 to 10/18 8:45. Per Brynda Rim. PA he will be not be available for the 8:15.

## 2016-03-27 NOTE — Progress Notes (Signed)
Cardiology Office Note:    Date:  03/28/2016   ID:  Robert Limes, Robert Owen, DOB 07-Jun-1946, MRN [id number]  PCP:  Marton Redwood, Robert Owen  Cardiologist:  Dr. Sherren Mocha   Electrophysiologist:  n/a  Referring Robert Owen: Marton Redwood, Robert Owen   Chief Complaint  Patient presents with  . Follow-up    CAD    History of Present Illness:    Robert Limes, Robert Owen is a 70 y.o. male with a hx of CAD s/p NSTEMI 5/13 ([place]) tx with DES x 2 (mid and dist) RCA, HTN, HL, cervical DDD.  He had a + treadmill test in 10/2012.  LHC demonstrated patent stents and mild to moderate nonobstructive disease elsewhere. Last seen by Dr. Sherren Mocha in 10/16.  He returns for FU.  He is doing very well.  He is working part-time at [employer].  He walks quite a bit while off work.  His blood pressure is usually at target. He did miss a dose of Metoprolol last night.  The patient denies chest pain, shortness of breath, syncope, orthopnea, PND or significant pedal edema.   Prior CV studies that were reviewed today include:    LHC (6/14):   Mid LAD 40-50 ostial circumflex 30-40 mid and distal RCA stents patent, proximal PDA 40 EF 55%  Past Medical History:  Diagnosis Date  . Bladder diverticulum   . CAD (coronary artery disease) cardiologist-  dr cooper   a. S/P NSTEMI 10/20/11: LHC at [facility] in [place], [place]:  mRCA 70% and 80% hazy, dRCA 90%, EF 65%; >>  PCI: Promus DES x 2 (3x38 mm and 3x12 mm) to mid and distal RCA;  b.  LHC (6/14):  Mid LAD 40-50, ostial circumflex 30-40, mid and distal RCA stents patent, proximal PDA 40, EF 55%  . Cervical disc disease    C 6 L radiculopathy on L  . Cervical radiculopathy at C6    left side  . Gross hematuria   . Heart murmur   . History of adenomatous polyp of colon    05-17-2015  tubular adenomas  . History of non-ST elevation myocardial infarction (NSTEMI)    10-20-2011  s/p  cardiac cath in [place], [place]  . HTN (hypertension)   . Hx of  adenomatous colonic polyps 05/23/2015  . Hyperlipidemia   . OA (osteoarthritis)   . Positive PPD   . S/P drug eluting coronary stent placement    10-21-2011  at [facility] in [place], [place]  DES x2 to distal and mid RCA  . Seasonal allergies   . Urgency of urination     Past Surgical History:  Procedure Laterality Date  . CATARACT EXTRACTION W/ INTRAOCULAR LENS  IMPLANT, BILATERAL  2014  and  2016  . COLONOSCOPY  last one 05-17-2015  . CORONARY ANGIOPLASTY WITH STENT PLACEMENT  10-21-2011  in [place], [place]   DES to mid and distal RCA  . CYSTOSCOPY WITH FULGERATION N/A 02/17/2016   Procedure: CYSTOSCOPY with fulgeration of bleeders;  Surgeon: Kathie Rhodes, Robert Owen;  Location: [facility];  Service: Urology;  Laterality: N/A;  . INGUINAL HERNIA REPAIR Left 06/20/2015   Procedure: LEFT INGUINAL HERNIA REPAIR WITH MESH;  Surgeon: Rolm Bookbinder, Robert Owen;  Location: [place];  Service: General;  Laterality: Left;  . INSERTION OF MESH Left 06/20/2015   Procedure: INSERTION OF MESH;  Surgeon: Rolm Bookbinder, Robert Owen;  Location: [place];  Service: General;  Laterality: Left;  . LEFT HEART CATHETERIZATION WITH CORONARY ANGIOGRAM N/A 11/14/2012   Procedure: LEFT HEART CATHETERIZATION WITH CORONARY ANGIOGRAM;  Surgeon: Sherren Mocha, Robert Owen;  Location: [facility] CATH LAB;  Service: Cardiovascular;  Laterality: N/A;  patent mid and distal RCA stent's;  ostial CFX 30%,  mLAD 40-50%,  pPDA 40%,  ef55%  . TONSILLECTOMY AND ADENOIDECTOMY  child  . WISDOM TOOTH EXTRACTION  1972    Current Medications: Current Meds  Medication Sig  . albuterol (PROVENTIL HFA;VENTOLIN HFA) 108 (90 Base) MCG/ACT inhaler Inhale into the lungs every 4 (four) hours as needed for wheezing or shortness of breath.  Marland Kitchen aspirin 81 MG tablet Take 81 mg by mouth daily.  . metoprolol tartrate (LOPRESSOR) 25 MG tablet Take 1 tablet (25 mg total) by mouth 2 (two) times daily.  . montelukast  (SINGULAIR) 10 MG tablet Take 10 mg by mouth daily as needed (allergies).  Marland Kitchen NITROSTAT 0.4 MG SL tablet PLACE 1 TABLET UNDER THE TONGUE EVERY 5 MINUTES AS NEEDED FOR CHEST PAIN.  Marland Kitchen phenazopyridine (PYRIDIUM) 200 MG tablet Take 1 tablet (200 mg total) by mouth 3 (three) times daily as needed for pain.  . rosuvastatin (CRESTOR) 40 MG tablet Take 40 mg by mouth daily.  Marland Kitchen zolpidem (AMBIEN) 5 MG tablet Take 1 tablet (5 mg total) by mouth at bedtime as needed for sleep.     Allergies:   Eugenol   Social History   Social History  . Marital status: Married    Spouse name: N/A  . Number of children: N/A  . Years of education: N/A   Social History Main Topics  . Smoking status: Never Smoker  . Smokeless tobacco: Never Used  . Alcohol use 0.0 oz/week     Comment: OCCASIONAL  . Drug use: No  . Sexual activity: Not Asked   Other Topics Concern  . 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     Family History:  The patient's family history includes Alcohol abuse in his father and mother; Dementia in his paternal grandmother; Diabetes in his brother and sister; Heart attack (age of onset: 64) in his father; Rheum arthritis in his paternal grandfather.   ROS:   Please see the history of present illness.    Review of Systems  Genitourinary: Positive for hematuria.       Cysto done recently with benign diverticulum >> hematuria   All other systems reviewed and are negative.   EKGs/Labs/Other Test Reviewed:    EKG:  EKG is  ordered today.  The ekg ordered today demonstrates sinus brady, HR 59, normal axis, j-point elevation, QTc 399 ms, 1st degree AVB (PR 222).  Recent Labs: 02/17/2016: BUN 17; Creatinine, Ser 0.80; Hemoglobin 14.6; Potassium 3.9; Sodium 141   Recent Lipid Panel    Component Value Date/Time   CHOL 154 06/30/2012 1414   TRIG 125.0 06/30/2012 1414   HDL 50.40 06/30/2012 1414   CHOLHDL 3 06/30/2012 1414   VLDL 25.0 06/30/2012 1414   LDLCALC 79 06/30/2012 1414      Physical Exam:    VS:  Ht (P) 5\' 7"  (1.702 m)   Wt 171 lb (77.6 kg)   BMI (P) 26.78 kg/m     Wt Readings from Last 3 Encounters:  03/28/16 171 lb (77.6 kg)  02/17/16 166 lb 8 oz (75.5 kg)  06/20/15 165 lb 12.8 oz (75.2 kg)     Physical Exam  Constitutional: He is oriented to person, place, and time.  He appears well-developed and well-nourished. No distress.  HENT:  Head: Normocephalic and atraumatic.  Eyes: No scleral icterus.  Neck: No JVD present. Carotid bruit is not present.  Cardiovascular: Normal rate, regular rhythm and normal heart sounds.   No murmur heard. Pulmonary/Chest: Effort normal. He has no wheezes. He has no rales.  Abdominal: Soft. He exhibits no mass. There is no tenderness.  Musculoskeletal: He exhibits no edema.  Neurological: He is alert and oriented to person, place, and time.  Skin: Skin is warm and dry.  Psychiatric: He has a normal mood and affect.    ASSESSMENT:    1. Coronary artery disease involving native coronary artery of native heart without angina pectoris   2. Essential hypertension   3. Pure hypercholesterolemia    PLAN:    In order of problems listed above:  1. CAD - s/p NSTEMI in 5/13 tx with DES x 2 to RCA.  LHC in 2014 with patent RCA stents and mild to mod non-obstructive disease elsewhere.  He is doing well without angina.  Continue ASA, statin, beta-blocker.  2. HTN - BP elevated today.  It is usually optimal when he checks it.  He will let us know if it continues to run high.  His blood pressure may be up today due to missing a dose of beta-blocker last night.   3. HL - Last LDL in 3/16 was 100. He has remained on Crestor 40. He did have Lipids done in 3/17 and will send those results to [place].   Medication Adjustments/Labs and Tests Ordered: Current medicines are reviewed at length with the patient today.  Concerns regarding medicines are outlined above.  Medication changes, Labs and Tests ordered today are outlined in  the Patient Instructions noted below. Patient Instructions  Medication Instructions:  No changes.  See your list.  Labwork: None.  Fax [place] your Lipid results from 08/2015.  Testing/Procedures: None   Follow-Up: Dr. Sherren Mocha or Richardson Dopp, PA-C in 1 year.   Any Other Special Instructions Will Be Listed Below (If Applicable).  If you need a refill on your cardiac medications before your next appointment, please call your pharmacy.   Signed, Richardson Dopp, PA-C  03/28/2016 9:37 AM    [facility] [address], [place], [place]  [address] Phone: [phone]; Fax: [phone]

## 2016-03-27 NOTE — Telephone Encounter (Signed)
Reached pt to confirm he is ok with the time change for his appt tomorrow with [place] states he called and s/w one of the operators to confirm this was fine, though the operator did not do a phone note, nor let me know that pt had called back and confirmed. This lead to me having to place a duplicate call the pt again, when the pt had already confirmed time change was fine. I apologized to the pt for my call tonight. Pt said that was ok and he thanked me for my follow up.

## 2016-03-28 ENCOUNTER — Ambulatory Visit: Payer: 59 | Admitting: Physician Assistant

## 2016-03-28 ENCOUNTER — Encounter: Payer: Self-pay | Admitting: Physician Assistant

## 2016-03-28 ENCOUNTER — Ambulatory Visit (INDEPENDENT_AMBULATORY_CARE_PROVIDER_SITE_OTHER): Payer: PPO | Admitting: Physician Assistant

## 2016-03-28 VITALS — Wt 171.0 lb

## 2016-03-28 DIAGNOSIS — I2510 Atherosclerotic heart disease of native coronary artery without angina pectoris: Secondary | ICD-10-CM

## 2016-03-28 DIAGNOSIS — I10 Essential (primary) hypertension: Secondary | ICD-10-CM

## 2016-03-28 DIAGNOSIS — E7800 Pure hypercholesterolemia, unspecified: Secondary | ICD-10-CM

## 2016-03-28 NOTE — Patient Instructions (Signed)
Medication Instructions:  No changes.  See your list.  Labwork: None.  Fax [place] your Lipid results from 08/2015.  Testing/Procedures: None   Follow-Up: Dr. Sherren Mocha or Richardson Dopp, PA-C in 1 year.   Any Other Special Instructions Will Be Listed Below (If Applicable).  If you need a refill on your cardiac medications before your next appointment, please call your pharmacy.

## 2016-04-09 MED FILL — ROSUVASTATIN CALCIUM 40 MG: 40 | 90 days supply | Qty: 90 | Fill #2

## 2016-04-25 MED FILL — METOPROLOL TARTRATE 25 MG T: 25 | 45 days supply | Qty: 90 | Fill #0

## 2016-05-25 DIAGNOSIS — Z961 Presence of intraocular lens: Secondary | ICD-10-CM | POA: Diagnosis not present

## 2016-05-25 DIAGNOSIS — H50011 Monocular esotropia, right eye: Secondary | ICD-10-CM | POA: Diagnosis not present

## 2016-05-25 DIAGNOSIS — H5203 Hypermetropia, bilateral: Secondary | ICD-10-CM | POA: Diagnosis not present

## 2016-05-25 DIAGNOSIS — H52223 Regular astigmatism, bilateral: Secondary | ICD-10-CM | POA: Diagnosis not present

## 2016-05-25 DIAGNOSIS — H4323 Crystalline deposits in vitreous body, bilateral: Secondary | ICD-10-CM | POA: Diagnosis not present

## 2016-05-25 DIAGNOSIS — H26492 Other secondary cataract, left eye: Secondary | ICD-10-CM | POA: Diagnosis not present

## 2016-06-13 MED FILL — METOPROLOL TARTRATE 25 MG T: 25 | 90 days supply | Qty: 180 | Fill #0

## 2016-06-25 ENCOUNTER — Telehealth: Payer: Self-pay | Admitting: Internal Medicine

## 2016-07-10 MED FILL — ROSUVASTATIN CALCIUM 40 MG: 40 | 90 days supply | Qty: 90 | Fill #3

## 2016-07-20 DIAGNOSIS — R310 Gross hematuria: Secondary | ICD-10-CM | POA: Diagnosis not present

## 2016-07-20 MED FILL — FINASTERIDE 5 MG TABLET: 5 | 30 days supply | Qty: 30 | Fill #0

## 2016-08-10 DIAGNOSIS — R0789 Other chest pain: Secondary | ICD-10-CM | POA: Diagnosis not present

## 2016-08-10 DIAGNOSIS — R310 Gross hematuria: Secondary | ICD-10-CM | POA: Diagnosis not present

## 2016-08-10 DIAGNOSIS — R079 Chest pain, unspecified: Secondary | ICD-10-CM | POA: Diagnosis not present

## 2016-08-10 DIAGNOSIS — R0781 Pleurodynia: Secondary | ICD-10-CM | POA: Diagnosis not present

## 2016-08-10 DIAGNOSIS — Z6827 Body mass index (BMI) 27.0-27.9, adult: Secondary | ICD-10-CM | POA: Diagnosis not present

## 2016-09-17 DIAGNOSIS — E784 Other hyperlipidemia: Secondary | ICD-10-CM | POA: Diagnosis not present

## 2016-09-17 DIAGNOSIS — Z6827 Body mass index (BMI) 27.0-27.9, adult: Secondary | ICD-10-CM | POA: Diagnosis not present

## 2016-09-17 DIAGNOSIS — M546 Pain in thoracic spine: Secondary | ICD-10-CM | POA: Diagnosis not present

## 2016-09-17 MED FILL — METOPROLOL TARTRATE 25 MG T: 25 | 90 days supply | Qty: 180 | Fill #1

## 2016-09-17 MED FILL — EZETIMIBE 10 MG TAB: 10 | 90 days supply | Qty: 90 | Fill #0

## 2016-10-15 MED FILL — ROSUVASTATIN CALCIUM 40 MG: 40 | 90 days supply | Qty: 90 | Fill #0

## 2016-12-05 DIAGNOSIS — H5200 Hypermetropia, unspecified eye: Secondary | ICD-10-CM | POA: Diagnosis not present

## 2016-12-05 DIAGNOSIS — Z961 Presence of intraocular lens: Secondary | ICD-10-CM | POA: Diagnosis not present

## 2016-12-05 DIAGNOSIS — H5111 Convergence insufficiency: Secondary | ICD-10-CM | POA: Diagnosis not present

## 2016-12-05 DIAGNOSIS — H26492 Other secondary cataract, left eye: Secondary | ICD-10-CM | POA: Diagnosis not present

## 2016-12-05 DIAGNOSIS — H524 Presbyopia: Secondary | ICD-10-CM | POA: Diagnosis not present

## 2016-12-05 DIAGNOSIS — H5053 Vertical heterophoria: Secondary | ICD-10-CM | POA: Diagnosis not present

## 2016-12-25 DIAGNOSIS — E784 Other hyperlipidemia: Secondary | ICD-10-CM | POA: Diagnosis not present

## 2016-12-28 ENCOUNTER — Telehealth: Payer: Self-pay | Admitting: *Deleted

## 2016-12-28 NOTE — Telephone Encounter (Signed)
Lmtcb to go over lab results . Results have also been sent to [place].

## 2016-12-28 NOTE — Telephone Encounter (Signed)
-----   Message from Liliane Shi, PA-C sent at 12/28/2016  9:12 AM EDT ----- Please call the patient Labs from PCP 12/26/16: SCr 0.9, K 4.7, HDL 61, LDL 62, ALT 60 Labs from PCP reviewed. Lipids at goal.  ALT stable. Continue current medications. Richardson Dopp, PA-C    12/28/2016 9:12 AM

## 2016-12-31 NOTE — Telephone Encounter (Signed)
Results sent to [facility] 

## 2017-01-01 MED FILL — METOPROLOL TARTRATE 25 MG T: 25 | 90 days supply | Qty: 180 | Fill #2

## 2017-01-01 MED FILL — EZETIMIBE 10 MG TAB: 10 | 90 days supply | Qty: 90 | Fill #1

## 2017-01-07 MED FILL — ROSUVASTATIN CALCIUM 40 MG: 40 | 90 days supply | Qty: 90 | Fill #1

## 2017-01-07 MED FILL — NITROGLYCERIN 0.4 MG TAB SL: 0.4 | 5 days supply | Qty: 25 | Fill #0

## 2017-04-02 DIAGNOSIS — Z6826 Body mass index (BMI) 26.0-26.9, adult: Secondary | ICD-10-CM | POA: Diagnosis not present

## 2017-04-02 DIAGNOSIS — M79674 Pain in right toe(s): Secondary | ICD-10-CM | POA: Diagnosis not present

## 2017-04-02 MED FILL — INDOMETHACIN 50 MG CAPSULE: 50 | 15 days supply | Qty: 30 | Fill #0

## 2017-04-02 MED FILL — EZETIMIBE 10 MG TABS: 10 | 90 days supply | Qty: 90 | Fill #2

## 2017-04-02 MED FILL — METOPROLOL TARTRATE 25 MG T: 25 | 90 days supply | Qty: 180 | Fill #0

## 2017-04-02 MED FILL — DOXYCYCLINE HYCLATE 100 MG: 100 | 7 days supply | Qty: 14 | Fill #0

## 2017-04-02 MED FILL — ROSUVASTATIN CALCIUM 40 MG: 40 | 90 days supply | Qty: 90 | Fill #2

## 2017-04-12 ENCOUNTER — Encounter: Payer: Self-pay | Admitting: Cardiovascular Disease

## 2017-04-15 DIAGNOSIS — D225 Melanocytic nevi of trunk: Secondary | ICD-10-CM | POA: Diagnosis not present

## 2017-04-15 DIAGNOSIS — D2262 Melanocytic nevi of left upper limb, including shoulder: Secondary | ICD-10-CM | POA: Diagnosis not present

## 2017-04-15 DIAGNOSIS — L814 Other melanin hyperpigmentation: Secondary | ICD-10-CM | POA: Diagnosis not present

## 2017-04-15 DIAGNOSIS — D485 Neoplasm of uncertain behavior of skin: Secondary | ICD-10-CM | POA: Diagnosis not present

## 2017-04-15 DIAGNOSIS — D1801 Hemangioma of skin and subcutaneous tissue: Secondary | ICD-10-CM | POA: Diagnosis not present

## 2017-04-15 DIAGNOSIS — L570 Actinic keratosis: Secondary | ICD-10-CM | POA: Diagnosis not present

## 2017-04-15 DIAGNOSIS — L821 Other seborrheic keratosis: Secondary | ICD-10-CM | POA: Diagnosis not present

## 2017-04-25 ENCOUNTER — Encounter: Payer: Self-pay | Admitting: Cardiovascular Disease

## 2017-04-25 ENCOUNTER — Ambulatory Visit: Payer: PPO | Admitting: Cardiovascular Disease

## 2017-04-25 VITALS — BP 154/72 | HR 56 | Ht 67.0 in | Wt 170.0 lb

## 2017-04-25 DIAGNOSIS — I2510 Atherosclerotic heart disease of native coronary artery without angina pectoris: Secondary | ICD-10-CM | POA: Diagnosis not present

## 2017-04-25 DIAGNOSIS — E785 Hyperlipidemia, unspecified: Secondary | ICD-10-CM | POA: Diagnosis not present

## 2017-04-25 DIAGNOSIS — I10 Essential (primary) hypertension: Secondary | ICD-10-CM

## 2017-04-25 NOTE — Progress Notes (Signed)
Cardiology Office Note Date:  04/26/2017   ID:  Robert Limes, Robert Owen, DOB [birth year], MRN [id number]  PCP:  Marton Redwood, Robert Owen  Cardiologist:  Janne Lab, Robert Owen    Chief Complaint  Patient presents with  . Coronary Artery Disease     History of Present Illness: Robert Limes, Robert Owen is a 71 y.o. male who presents for follow-up of coronary artery disease.  The patient initially presented in 2013 with a non-ST elevation infarction.  He was found to have severe stenosis in the right coronary artery and was treated with drug-eluting stent platforms.  Repeat heart catheterization in 2014 demonstrated patency of his stent sites and moderate nonobstructive disease elsewhere.  He has been managed medically and has done well over time.  Patient is feeling well.  He denies chest pain or shortness of breath.  He denies heart palpitations, orthopnea, PND, or leg swelling.  He is compliant with his medications.  He is physically active and walks 11-7998 steps per day on average.   Past Medical History:  Diagnosis Date  . Bladder diverticulum   . CAD (coronary artery disease) cardiologist-  dr Reginold Beale   a. S/P NSTEMI 10/20/11: LHC at [facility] in [place], [place]:  mRCA 70% and 80% hazy, dRCA 90%, EF 65%; >>  PCI: Promus DES x 2 (3x38 mm and 3x12 mm) to mid and distal RCA;  b.  LHC (6/14):  Mid LAD 40-50, ostial circumflex 30-40, mid and distal RCA stents patent, proximal PDA 40, EF 55%  . Cervical disc disease    C 6 L radiculopathy on L  . Cervical radiculopathy at C6    left side  . Gross hematuria   . Heart murmur   . History of adenomatous polyp of colon    05-17-2015  tubular adenomas  . History of non-ST elevation myocardial infarction (NSTEMI)    10-20-2011  s/p  cardiac cath in [place], [place]  . HTN (hypertension)   . Hx of adenomatous colonic polyps 05/23/2015  . Hyperlipidemia   . OA (osteoarthritis)   . Positive PPD   . S/P drug eluting coronary stent placement    10-21-2011  at [facility] in [place], [place]  DES x2 to distal and mid RCA  . Seasonal allergies   . Urgency of urination     Past Surgical History:  Procedure Laterality Date  . CATARACT EXTRACTION W/ INTRAOCULAR LENS  IMPLANT, BILATERAL  2014  and  2016  . COLONOSCOPY  last one 05-17-2015  . CORONARY ANGIOPLASTY WITH STENT PLACEMENT  10-21-2011  in [place], [place]   DES to mid and distal RCA  . CYSTOSCOPY with fulgeration of bleeders N/A 02/17/2016   Performed by Kathie Rhodes, Robert Owen at [facility]  . INSERTION OF MESH Left 06/20/2015   Performed by Rolm Bookbinder, Robert Owen at [facility]  . LEFT HEART CATHETERIZATION WITH CORONARY ANGIOGRAM N/A 11/14/2012   Performed by Sherren Mocha, Robert Owen at [facility] CATH LAB  . LEFT INGUINAL HERNIA REPAIR WITH MESH Left 06/20/2015   Performed by Rolm Bookbinder, Robert Owen at [facility]  . TONSILLECTOMY AND ADENOIDECTOMY  child  . WISDOM TOOTH EXTRACTION  1972    Current Outpatient Medications  Medication Sig Dispense Refill  . aspirin 81 MG tablet Take 81 mg by mouth daily.    Marland Kitchen ezetimibe (ZETIA) 10 MG tablet Take 10 mg daily by mouth.  3  . metoprolol tartrate (LOPRESSOR) 25 MG tablet  Take 1 tablet (25 mg total) by mouth 2 (two) times daily. 180 tablet 3  . NITROSTAT 0.4 MG SL tablet PLACE 1 TABLET UNDER THE TONGUE EVERY 5 MINUTES AS NEEDED FOR CHEST PAIN. 25 tablet 3  . rosuvastatin (CRESTOR) 40 MG tablet Take 40 mg by mouth daily.     No current facility-administered medications for this visit.     Allergies:   Eugenol   Social History:  The patient  reports that  has never smoked. he has never used smokeless tobacco. He reports that he drinks alcohol. He reports that he does not use drugs.   Family History:  The patient's  family history includes Alcohol abuse in his father and mother; Dementia in his paternal grandmother; Diabetes in his brother and sister; Heart attack (age of onset: 32) in his father;  Rheum arthritis in his paternal grandfather.    ROS:  Please see the history of present illness.  Otherwise, review of systems is positive for left scapular pain, right pleuritic pain, blood in urine which was evaluated with cystoscopy and found to be secondary to a bladder diverticulum.  All other systems are reviewed and negative.    PHYSICAL EXAM: VS:  BP (!) 154/72   Pulse (!) 56   Ht 5\' 7"  (1.702 m)   Wt 170 lb (77.1 kg)   BMI 26.63 kg/m  , BMI Body mass index is 26.63 kg/m. GEN: Well nourished, well developed, in no acute distress  HEENT: normal  Neck: no JVD, no masses. No carotid bruits Cardiac: RRR without murmur or gallop                Respiratory:  clear to auscultation bilaterally, normal work of breathing GI: soft, nontender, nondistended, + BS MS: no deformity or atrophy  Ext: no pretibial edema, pedal pulses 2+= bilaterally Skin: warm and dry, no rash Neuro:  Strength and sensation are intact Psych: euthymic mood, full affect  EKG:  EKG is ordered today. The ekg ordered today shows sinus bradycardia 56 bpm, first-degree AV block, otherwise within normal limits.  Recent Labs: No results found for requested labs within last 8760 hours.   Lipid Panel     Component Value Date/Time   CHOL 154 06/30/2012 1414   TRIG 125.0 06/30/2012 1414   HDL 50.40 06/30/2012 1414   CHOLHDL 3 06/30/2012 1414   VLDL 25.0 06/30/2012 1414   LDLCALC 79 06/30/2012 1414      Wt Readings from Last 3 Encounters:  04/25/17 170 lb (77.1 kg)  03/28/16 171 lb (77.6 kg)  02/17/16 166 lb 8 oz (75.5 kg)     ASSESSMENT AND PLAN: 1.  Coronary artery disease, native vessel, without angina: The patient is stable from a cardiac perspective.  His medications are reviewed and no changes recommended today.  He will continue on aspirin for antiplatelet therapy, a beta-blocker, and a high intensity statin drug.  2.  Hyperlipidemia: Treated with a combination of Crestor and Zetia.  Labs from  December 25, 2016 reviewed demonstrating a cholesterol of 135, HDL 61, LDL 62  3.  [place] hypertension: Patient reports normal blood pressures in an ambulatory setting.  States his blood pressure spikes when he is in a hurry.  He will check blood pressures and call in if readings are greater than 140/90.  Current medicines are reviewed with the patient today.  The patient does not have concerns regarding medicines.  Labs/ tests ordered today include:   Orders Placed This Encounter  Procedures  . EKG 12-Lead    Disposition:   FU one year  Signed, Janne Lab, Robert Owen  04/26/2017 5:55 PM    [facility] [place], [place], [place]  [address] Phone: [phone]; Fax: [phone]

## 2017-04-25 NOTE — Patient Instructions (Signed)

## 2017-04-26 ENCOUNTER — Encounter: Payer: Self-pay | Admitting: Cardiovascular Disease

## 2017-05-06 NOTE — Telephone Encounter (Signed)
Created in error

## 2017-06-14 ENCOUNTER — Telehealth: Payer: Self-pay

## 2017-06-14 NOTE — Telephone Encounter (Signed)
Patient in the office today to pick up travel form previously given to Dr. Burt Knack. Patient provided cell number and requested he is called when they are done.  Dr. Burt Knack filled out paperwork. Informed Dr. Linna Darner it has been placed at the front desk for him to pick up Monday during business hours at his convenience. He was grateful for assistance.

## 2017-07-04 MED FILL — ROSUVASTATIN CALCIUM 40 MG: 40 | 90 days supply | Qty: 90 | Fill #0

## 2017-07-04 MED FILL — EZETIMIBE 10 MG TABS: 10 | 90 days supply | Qty: 90 | Fill #3

## 2017-07-04 MED FILL — METOPROLOL TARTRATE 25 MG T: 25 | 90 days supply | Qty: 180 | Fill #1

## 2017-10-07 DIAGNOSIS — Z1389 Encounter for screening for other disorder: Secondary | ICD-10-CM | POA: Diagnosis not present

## 2017-10-07 DIAGNOSIS — Z9861 Coronary angioplasty status: Secondary | ICD-10-CM | POA: Diagnosis not present

## 2017-10-07 DIAGNOSIS — M546 Pain in thoracic spine: Secondary | ICD-10-CM | POA: Diagnosis not present

## 2017-10-07 DIAGNOSIS — Z86010 Personal history of colonic polyps: Secondary | ICD-10-CM | POA: Diagnosis not present

## 2017-10-07 DIAGNOSIS — R7301 Impaired fasting glucose: Secondary | ICD-10-CM | POA: Diagnosis not present

## 2017-10-07 DIAGNOSIS — Z6826 Body mass index (BMI) 26.0-26.9, adult: Secondary | ICD-10-CM | POA: Diagnosis not present

## 2017-10-07 DIAGNOSIS — I252 Old myocardial infarction: Secondary | ICD-10-CM | POA: Diagnosis not present

## 2017-10-07 DIAGNOSIS — G5601 Carpal tunnel syndrome, right upper limb: Secondary | ICD-10-CM | POA: Diagnosis not present

## 2017-10-07 DIAGNOSIS — I10 Essential (primary) hypertension: Secondary | ICD-10-CM | POA: Diagnosis not present

## 2017-10-07 DIAGNOSIS — Z0000 Encounter for general adult medical examination without abnormal findings: Secondary | ICD-10-CM | POA: Diagnosis not present

## 2017-10-07 DIAGNOSIS — E7849 Other hyperlipidemia: Secondary | ICD-10-CM | POA: Diagnosis not present

## 2017-10-07 MED FILL — ROSUVASTATIN CALCIUM 40 MG: 40 | 90 days supply | Qty: 90 | Fill #0

## 2017-10-07 MED FILL — METOPROLOL TARTRATE 25 MG T: 25 | 90 days supply | Qty: 180 | Fill #0

## 2017-10-07 MED FILL — EZETIMIBE 10 MG TABLET: 10 | 90 days supply | Qty: 90 | Fill #0

## 2017-12-26 MED FILL — predniSONE 20 MG TABS: 20 | 6 days supply | Qty: 12 | Fill #0

## 2018-01-03 DIAGNOSIS — R05 Cough: Secondary | ICD-10-CM | POA: Diagnosis not present

## 2018-01-03 DIAGNOSIS — I10 Essential (primary) hypertension: Secondary | ICD-10-CM | POA: Diagnosis not present

## 2018-01-03 DIAGNOSIS — J40 Bronchitis, not specified as acute or chronic: Secondary | ICD-10-CM | POA: Diagnosis not present

## 2018-01-03 DIAGNOSIS — Z6826 Body mass index (BMI) 26.0-26.9, adult: Secondary | ICD-10-CM | POA: Diagnosis not present

## 2018-01-03 MED FILL — predniSONE 20 MG TABS: 20 | 15 days supply | Qty: 30 | Fill #0

## 2018-01-03 MED FILL — PROAIR HFA 90 MCG INHALER: 108 (90 BAS | 25 days supply | Qty: 9 | Fill #0

## 2018-01-08 MED FILL — METOPROLOL TARTRATE 25 MG T: 25 | 90 days supply | Qty: 180 | Fill #1

## 2018-01-08 MED FILL — ROSUVASTATIN CALCIUM 40 MG: 40 | 90 days supply | Qty: 90 | Fill #1

## 2018-01-24 MED FILL — EZETIMIBE 10 MG TABLET: 10 | 90 days supply | Qty: 90 | Fill #1

## 2018-04-10 MED FILL — ROSUVASTATIN CALCIUM 40 MG: 40 | 90 days supply | Qty: 90 | Fill #2

## 2018-04-10 MED FILL — METOPROLOL TARTRATE 25 MG T: 25 | 90 days supply | Qty: 180 | Fill #2

## 2018-04-21 DIAGNOSIS — L821 Other seborrheic keratosis: Secondary | ICD-10-CM | POA: Diagnosis not present

## 2018-04-21 DIAGNOSIS — L570 Actinic keratosis: Secondary | ICD-10-CM | POA: Diagnosis not present

## 2018-04-21 DIAGNOSIS — L720 Epidermal cyst: Secondary | ICD-10-CM | POA: Diagnosis not present

## 2018-04-21 DIAGNOSIS — D225 Melanocytic nevi of trunk: Secondary | ICD-10-CM | POA: Diagnosis not present

## 2018-04-21 MED FILL — FLUOROURACIL 5% CREAM: 5 | 14 days supply | Qty: 40 | Fill #0

## 2018-04-23 ENCOUNTER — Encounter: Payer: Self-pay | Admitting: Internal Medicine

## 2018-05-05 MED FILL — EZETIMIBE 10 MG TABLET: 10 | 90 days supply | Qty: 90 | Fill #2

## 2018-05-14 ENCOUNTER — Encounter: Payer: Self-pay | Admitting: Internal Medicine

## 2018-05-14 ENCOUNTER — Ambulatory Visit (AMBULATORY_SURGERY_CENTER): Payer: Self-pay | Admitting: *Deleted

## 2018-05-14 VITALS — Ht 67.0 in | Wt 166.0 lb

## 2018-05-14 DIAGNOSIS — Z86010 Personal history of colonic polyps: Secondary | ICD-10-CM

## 2018-05-14 NOTE — Progress Notes (Signed)
No egg or soy allergy known to patient  No issues with past sedation with any surgeries  or procedures, no intubation problems  No diet pills per patient No home 02 use per patient  No blood thinners per patient  Pt denies issues with constipation  No A fib or A flutter  EMMI video sent to pt's e mail pt declined   

## 2018-05-28 ENCOUNTER — Ambulatory Visit (AMBULATORY_SURGERY_CENTER): Payer: PPO | Admitting: Internal Medicine

## 2018-05-28 ENCOUNTER — Encounter: Payer: Self-pay | Admitting: Internal Medicine

## 2018-05-28 VITALS — BP 131/65 | HR 62 | Temp 97.5°F | Resp 11 | Ht 67.0 in | Wt 166.0 lb

## 2018-05-28 DIAGNOSIS — D122 Benign neoplasm of ascending colon: Secondary | ICD-10-CM

## 2018-05-28 DIAGNOSIS — Z1211 Encounter for screening for malignant neoplasm of colon: Secondary | ICD-10-CM | POA: Diagnosis not present

## 2018-05-28 DIAGNOSIS — Z86010 Personal history of colonic polyps: Secondary | ICD-10-CM

## 2018-05-28 MED ORDER — SODIUM CHLORIDE 0.9 % IV SOLN: 500.0000 mL | Freq: Once | INTRAVENOUS | Status: DC

## 2018-05-28 NOTE — Progress Notes (Signed)
Pt's states no medical or surgical changes since previsit or office visit. 

## 2018-05-28 NOTE — Progress Notes (Signed)
Called to room to assist during endoscopic procedure.  Patient ID and intended procedure confirmed with present staff. Received instructions for my participation in the procedure from the performing physician.  

## 2018-05-28 NOTE — Op Note (Signed)
[name] Procedure Date: 05/28/2018 2:34 PM MRN: [id number] Endoscopist: Gatha Mayer , MD Age: 72 Referring MD:  Date of Birth: February 06, 1946 Gender: Male Account #: [id number] Procedure:                Colonoscopy Indications:              Surveillance: Personal history of adenomatous                            polyps on last colonoscopy 3 years ago Medicines:                Propofol per Anesthesia, Monitored Anesthesia Care Procedure:                Pre-Anesthesia Assessment:                           - Prior to the procedure, a History and Physical                            was performed, and patient medications and                            allergies were reviewed. The patient's tolerance of                            previous anesthesia was also reviewed. The risks                            and benefits of the procedure and the sedation                            options and risks were discussed with the patient.                            All questions were answered, and informed consent                            was obtained. Prior Anticoagulants: The patient has                            taken no previous anticoagulant or antiplatelet                            agents. ASA Grade Assessment: III - A patient with                            severe systemic disease. After reviewing the risks                            and benefits, the patient was deemed in                            satisfactory condition to undergo the procedure.  After obtaining informed consent, the colonoscope                            was passed under direct vision. Throughout the                            procedure, the patient's blood pressure, pulse, and                            oxygen saturations were monitored continuously. The                            Colonoscope was introduced through the anus and   advanced to the the cecum, identified by                            appendiceal orifice and ileocecal valve. The                            colonoscopy was performed without difficulty. The                            patient tolerated the procedure well. The quality                            of the bowel preparation was good. The ileocecal                            valve, appendiceal orifice, and rectum were                            photographed. The bowel preparation used was                            Miralax. Scope In: 2:50:40 PM Scope Out: 3:06:53 PM Scope Withdrawal Time: 0 hours 13 minutes 24 seconds  Total Procedure Duration: 0 hours 16 minutes 13 seconds  Findings:                 The perianal examination was normal.                           The digital rectal exam findings include enlarged                            prostate.                           A 2 to 3 mm polyp was found in the ascending colon.                            The polyp was flat. The polyp was removed with a                            cold snare. Resection and retrieval were complete.  Verification of patient identification for the                            specimen was done. Estimated blood loss was minimal.                           The exam was otherwise without abnormality on                            direct and retroflexion views. Complications:            No immediate complications. Estimated Blood Loss:     Estimated blood loss was minimal. Impression:               - Enlarged prostate found on digital rectal exam.                            Mild-moderate and smooth                           - One 2 to 3 mm polyp in the ascending colon,                            removed with a cold snare. Resected and retrieved.                           - The examination was otherwise normal on direct                            and retroflexion views.                           -  Personal history of colonic polyps - 3 adenomas                            2016. Recommendation:           - Patient has a contact number available for                            emergencies. The signs and symptoms of potential                            delayed complications were discussed with the                            patient. Return to normal activities tomorrow.                            Written discharge instructions were provided to the                            patient.                           - Resume previous diet.                           -  Continue present medications.                           - Repeat colonoscopy may be recommended. This will                            be determined after pathology results from today's                            exam become available for review. Gatha Mayer, MD 05/28/2018 3:14:48 PM This report has been signed electronically.

## 2018-05-28 NOTE — Patient Instructions (Addendum)
   I found and removed one 2-3 mm polyp from the ascending colon. All else ok.  I will let you know pathology results and when/if  to have another routine colonoscopy by mail and/or My Chart.  I appreciate the opportunity to care for you. Gatha Mayer, MD, FACG   YOU HAD AN ENDOSCOPIC PROCEDURE TODAY AT [facility]:   Refer to the procedure report that was given to you for any specific questions about what was found during the examination.  If the procedure report does not answer your questions, please call your gastroenterologist to clarify.  If you requested that your care partner not be given the details of your procedure findings, then the procedure report has been included in a sealed envelope for you to review at your convenience later.  YOU SHOULD EXPECT: Some feelings of bloating in the abdomen. Passage of more gas than usual.  Walking can help get rid of the air that was put into your GI tract during the procedure and reduce the bloating. If you had a lower endoscopy (such as a colonoscopy or flexible sigmoidoscopy) you may notice spotting of blood in your stool or on the toilet paper. If you underwent a bowel prep for your procedure, you may not have a normal bowel movement for a few days.  Please Note:  You might notice some irritation and congestion in your nose or some drainage.  This is from the oxygen used during your procedure.  There is no need for concern and it should clear up in a day or so.  SYMPTOMS TO REPORT IMMEDIATELY:   Following lower endoscopy (colonoscopy or flexible sigmoidoscopy):  Excessive amounts of blood in the stool  Significant tenderness or worsening of abdominal pains  Swelling of the abdomen that is new, acute  Fever of 100F or higher   For urgent or emergent issues, a gastroenterologist can be reached at any hour by calling [phone].   DIET:  We do recommend a small meal at first, but then you may proceed to your  regular diet.  Drink plenty of fluids but you should avoid alcoholic beverages for 24 hours.  ACTIVITY:  You should plan to take it easy for the rest of today and you should NOT DRIVE or use heavy machinery until tomorrow (because of the sedation medicines used during the test).    FOLLOW UP: Our staff will call the number listed on your records the next business day following your procedure to check on you and address any questions or concerns that you may have regarding the information given to you following your procedure. If we do not reach you, we will leave a message.  However, if you are feeling well and you are not experiencing any problems, there is no need to return our call.  We will assume that you have returned to your regular daily activities without incident.  If any biopsies were taken you will be contacted by phone or by letter within the next 1-3 weeks.  Please call us at [phone] if you have not heard about the biopsies in 3 weeks.    SIGNATURES/CONFIDENTIALITY: You and/or your care partner have signed paperwork which will be entered into your electronic medical record.  These signatures attest to the fact that that the information above on your After Visit Summary has been reviewed and is understood.  Full responsibility of the confidentiality of this discharge information lies with you and/or your care-partner.

## 2018-05-28 NOTE — Progress Notes (Signed)
PT taken to PACU. Monitors in place. VSS. Report given to RN. 

## 2018-05-29 ENCOUNTER — Telehealth: Payer: Self-pay

## 2018-05-29 ENCOUNTER — Telehealth: Payer: Self-pay | Admitting: *Deleted

## 2018-05-29 NOTE — Telephone Encounter (Signed)
Follow up call made, name identifier, left a voicemail. 

## 2018-05-29 NOTE — Telephone Encounter (Signed)
  Follow up Call-  Call back number 05/28/2018  Post procedure Call Back phone  # [phone]  Permission to leave phone message Yes  Some recent data might be hidden     Patient questions:  Do you have a fever, pain , or abdominal swelling? No. Pain Score  0 *  Have you tolerated food without any problems? Yes.    Have you been able to return to your normal activities? Yes.    Do you have any questions about your discharge instructions: Diet   No. Medications  No. Follow up visit  No.  Do you have questions or concerns about your Care? No.  Actions: * If pain score is 4 or above: No action needed, pain <4.

## 2018-06-09 ENCOUNTER — Encounter: Payer: Self-pay | Admitting: Internal Medicine

## 2018-06-09 NOTE — Progress Notes (Signed)
Diminutive ssp Consider repeating colonoscopy 5 yrs - will be 33 My chart letter

## 2018-06-25 DIAGNOSIS — L720 Epidermal cyst: Secondary | ICD-10-CM | POA: Diagnosis not present

## 2018-06-25 DIAGNOSIS — L244 Irritant contact dermatitis due to drugs in contact with skin: Secondary | ICD-10-CM | POA: Diagnosis not present

## 2018-07-08 MED FILL — ROSUVASTATIN CALCIUM 40 MG: 40 | 90 days supply | Qty: 90 | Fill #3

## 2018-07-15 MED FILL — METOPROLOL TARTRATE 25 MG T: 25 | 90 days supply | Qty: 180 | Fill #3

## 2018-08-26 MED FILL — EZETIMIBE 10 MG TABS: 10 | 90 days supply | Qty: 90 | Fill #3

## 2018-10-08 MED FILL — ROSUVASTATIN CALCIUM 40 MG: 40 | 90 days supply | Qty: 90 | Fill #0

## 2018-10-08 MED FILL — METOPROLOL TARTRATE 25 MG T: 25 | 90 days supply | Qty: 180 | Fill #0

## 2018-11-06 DIAGNOSIS — E7849 Other hyperlipidemia: Secondary | ICD-10-CM | POA: Diagnosis not present

## 2018-11-06 DIAGNOSIS — I10 Essential (primary) hypertension: Secondary | ICD-10-CM | POA: Diagnosis not present

## 2018-11-06 DIAGNOSIS — Z125 Encounter for screening for malignant neoplasm of prostate: Secondary | ICD-10-CM | POA: Diagnosis not present

## 2018-11-06 DIAGNOSIS — R7301 Impaired fasting glucose: Secondary | ICD-10-CM | POA: Diagnosis not present

## 2018-11-07 DIAGNOSIS — R82998 Other abnormal findings in urine: Secondary | ICD-10-CM | POA: Diagnosis not present

## 2018-11-07 DIAGNOSIS — I10 Essential (primary) hypertension: Secondary | ICD-10-CM | POA: Diagnosis not present

## 2018-11-12 DIAGNOSIS — Z86010 Personal history of colonic polyps: Secondary | ICD-10-CM | POA: Diagnosis not present

## 2018-11-12 DIAGNOSIS — I252 Old myocardial infarction: Secondary | ICD-10-CM | POA: Diagnosis not present

## 2018-11-12 DIAGNOSIS — Z0000 Encounter for general adult medical examination without abnormal findings: Secondary | ICD-10-CM | POA: Diagnosis not present

## 2018-11-12 DIAGNOSIS — Z1331 Encounter for screening for depression: Secondary | ICD-10-CM | POA: Diagnosis not present

## 2018-11-12 DIAGNOSIS — R809 Proteinuria, unspecified: Secondary | ICD-10-CM | POA: Diagnosis not present

## 2018-11-12 DIAGNOSIS — E785 Hyperlipidemia, unspecified: Secondary | ICD-10-CM | POA: Diagnosis not present

## 2018-11-12 DIAGNOSIS — R7301 Impaired fasting glucose: Secondary | ICD-10-CM | POA: Diagnosis not present

## 2018-11-12 DIAGNOSIS — I10 Essential (primary) hypertension: Secondary | ICD-10-CM | POA: Diagnosis not present

## 2018-11-12 DIAGNOSIS — M50123 Cervical disc disorder at C6-C7 level with radiculopathy: Secondary | ICD-10-CM | POA: Diagnosis not present

## 2018-11-12 DIAGNOSIS — Z9861 Coronary angioplasty status: Secondary | ICD-10-CM | POA: Diagnosis not present

## 2018-11-28 MED FILL — EZETIMIBE 10 MG TABS: 10 | 90 days supply | Qty: 90 | Fill #0

## 2018-12-02 ENCOUNTER — Telehealth: Payer: Self-pay | Admitting: *Deleted

## 2018-12-02 DIAGNOSIS — Z20822 Contact with and (suspected) exposure to covid-19: Secondary | ICD-10-CM

## 2018-12-02 NOTE — Telephone Encounter (Signed)
[facility] called to refer this patient for covid-19 testing. Pt notified and scheduled for Monday (after speaking with [facility]) June 29 th at 8 am at [facility]. Advised pt that this a drive thru test site and to stay in car, wear mask with windows rolled up until ready to be tested.  He voiced understanding.

## 2018-12-08 ENCOUNTER — Other Ambulatory Visit: Payer: PPO

## 2018-12-08 DIAGNOSIS — Z20822 Contact with and (suspected) exposure to covid-19: Secondary | ICD-10-CM

## 2019-01-02 DIAGNOSIS — B342 Coronavirus infection, unspecified: Secondary | ICD-10-CM | POA: Diagnosis not present

## 2019-01-05 MED FILL — ROSUVASTATIN CALCIUM 40 MG: 40 | 90 days supply | Qty: 90 | Fill #0

## 2019-01-16 MED FILL — METOPROLOL TARTRATE 25 MG T: 25 | 90 days supply | Qty: 180 | Fill #0

## 2019-02-26 MED FILL — EZETIMIBE 10 MG TABS: 10 | 90 days supply | Qty: 90 | Fill #1

## 2019-04-07 MED FILL — ROSUVASTATIN CALCIUM 40 MG: 40 | 90 days supply | Qty: 90 | Fill #0

## 2019-04-24 MED FILL — METOPROLOL TARTRATE 25 MG T: 25 | 90 days supply | Qty: 180 | Fill #0

## 2019-06-24 MED FILL — EZETIMIBE 10 MG TABS: 10 | 90 days supply | Qty: 90 | Fill #2

## 2019-07-08 MED FILL — ROSUVASTATIN CALCIUM 40 MG: 40 | 90 days supply | Qty: 90 | Fill #1

## 2019-07-31 MED FILL — METOPROLOL TARTRATE 25 MG T: 25 | 90 days supply | Qty: 180 | Fill #1

## 2019-09-23 ENCOUNTER — Encounter: Payer: Self-pay | Admitting: Cardiovascular Disease

## 2019-10-07 MED FILL — EZETIMIBE 10 MG TABS: 10 | 90 days supply | Qty: 90 | Fill #3

## 2019-10-07 MED FILL — ROSUVASTATIN CALCIUM 40 MG: 40 | 90 days supply | Qty: 90 | Fill #2

## 2019-10-15 DIAGNOSIS — H04122 Dry eye syndrome of left lacrimal gland: Secondary | ICD-10-CM | POA: Diagnosis not present

## 2019-10-15 DIAGNOSIS — H02054 Trichiasis without entropian left upper eyelid: Secondary | ICD-10-CM | POA: Diagnosis not present

## 2019-10-16 DIAGNOSIS — R079 Chest pain, unspecified: Secondary | ICD-10-CM | POA: Diagnosis not present

## 2019-10-16 DIAGNOSIS — K219 Gastro-esophageal reflux disease without esophagitis: Secondary | ICD-10-CM | POA: Diagnosis not present

## 2019-10-16 DIAGNOSIS — R0609 Other forms of dyspnea: Secondary | ICD-10-CM | POA: Diagnosis not present

## 2019-10-16 DIAGNOSIS — Z9861 Coronary angioplasty status: Secondary | ICD-10-CM | POA: Diagnosis not present

## 2019-10-16 DIAGNOSIS — R5383 Other fatigue: Secondary | ICD-10-CM | POA: Diagnosis not present

## 2019-10-16 MED FILL — NITROGLYCERIN 0.4 MG TAB SL: 0.4 | 5 days supply | Qty: 25 | Fill #0

## 2019-10-23 ENCOUNTER — Encounter: Payer: Self-pay | Admitting: Cardiovascular Disease

## 2019-10-23 ENCOUNTER — Ambulatory Visit: Payer: PPO | Admitting: Cardiovascular Disease

## 2019-10-23 ENCOUNTER — Other Ambulatory Visit: Payer: Self-pay | Admitting: Cardiovascular Disease

## 2019-10-23 ENCOUNTER — Other Ambulatory Visit: Payer: Self-pay

## 2019-10-23 VITALS — BP 142/68 | HR 79 | Ht 67.0 in | Wt 159.6 lb

## 2019-10-23 DIAGNOSIS — I10 Essential (primary) hypertension: Secondary | ICD-10-CM

## 2019-10-23 DIAGNOSIS — I25119 Atherosclerotic heart disease of native coronary artery with unspecified angina pectoris: Secondary | ICD-10-CM

## 2019-10-23 DIAGNOSIS — I25118 Atherosclerotic heart disease of native coronary artery with other forms of angina pectoris: Secondary | ICD-10-CM

## 2019-10-23 DIAGNOSIS — E782 Mixed hyperlipidemia: Secondary | ICD-10-CM | POA: Diagnosis not present

## 2019-10-23 DIAGNOSIS — R0989 Other specified symptoms and signs involving the circulatory and respiratory systems: Secondary | ICD-10-CM | POA: Diagnosis not present

## 2019-10-23 LAB — CBC WITH DIFFERENTIAL/PLATELET
Basophils Absolute: 0 10*3/uL (ref 0.0–0.2)
Basos: 0 %
EOS (ABSOLUTE): 0.9 10*3/uL — ABNORMAL HIGH (ref 0.0–0.4)
Eos: 15 %
Hematocrit: 46.1 % (ref 37.5–51.0)
Hemoglobin: 15.7 g/dL (ref 13.0–17.7)
Lymphocytes Absolute: 1.2 10*3/uL (ref 0.7–3.1)
Lymphs: 20 %
MCH: 32.5 pg (ref 26.6–33.0)
MCHC: 34.1 g/dL (ref 31.5–35.7)
MCV: 95 fL (ref 79–97)
Monocytes Absolute: 0.8 10*3/uL (ref 0.1–0.9)
Monocytes: 13 %
Neutrophils Absolute: 3.2 10*3/uL (ref 1.4–7.0)
Neutrophils: 52 %
Platelets: 186 10*3/uL (ref 150–450)
RBC: 4.83 x10E6/uL (ref 4.14–5.80)
RDW: 13 % (ref 11.6–15.4)
WBC: 6.1 10*3/uL (ref 3.4–10.8)

## 2019-10-23 LAB — BASIC METABOLIC PANEL
BUN/Creatinine Ratio: 25 — ABNORMAL HIGH (ref 10–24)
BUN: 18 mg/dL (ref 8–27)
CO2: 30 mmol/L — ABNORMAL HIGH (ref 20–29)
Calcium: 9.7 mg/dL (ref 8.6–10.2)
Chloride: 106 mmol/L (ref 96–106)
Creatinine, Ser: 0.73 mg/dL — ABNORMAL LOW (ref 0.76–1.27)
GFR calc Af Amer: 106 mL/min/{1.73_m2} (ref >59)
GFR calc non Af Amer: 91 mL/min/{1.73_m2} (ref >59)
Glucose: 104 mg/dL — ABNORMAL HIGH (ref 65–99)
Potassium: 5 mmol/L (ref 3.5–5.2)
Sodium: 142 mmol/L (ref 134–144)

## 2019-10-23 MED ORDER — PANTOPRAZOLE SODIUM 40 MG PO TBEC: 40.0000 mg | DELAYED_RELEASE_TABLET | Freq: Every day | ORAL | 3 refills | Status: DC

## 2019-10-23 MED ORDER — METOPROLOL TARTRATE 50 MG PO TABS: 50.0000 mg | ORAL_TABLET | Freq: Two times a day (BID) | ORAL | 3 refills | Status: DC

## 2019-10-23 MED ORDER — CLOPIDOGREL BISULFATE 75 MG PO TABS: 75.0000 mg | ORAL_TABLET | Freq: Every day | ORAL | 3 refills | Status: DC

## 2019-10-23 NOTE — Progress Notes (Signed)
Cardiology Office Note:    Date:  10/24/2019   ID:  Robert Limes, Robert Owen, DOB 1946-03-09, MRN [id number]  PCP:  Marton Redwood, Robert Owen  Cardiologist:  Sherren Mocha, Robert Owen  Electrophysiologist:  None   Referring Robert Owen: Marton Redwood, Robert Owen   Chief Complaint  Patient presents with  . Coronary Artery Disease    History of Present Illness:    Robert Limes, Robert Owen is a 74 y.o. male with a hx of coronary artery disease.  The patient initially presented in 2013 with a non-ST elevation infarction.  He was found to have severe stenosis in the right coronary artery and was treated with drug-eluting stent platforms.  Repeat heart catheterization in 2014 demonstrated patency of his stent sites and moderate nonobstructive disease elsewhere.  Robert Owen is here alone today. He has been experiencing exertional chest pressure and shortness of breath for 4-6 weeks, worse in cold weather. This occurs when he walks for exercise, especially when going up hill. There is a pressure like sensation in the center of his chest associated with dyspnea. Chest pain resolves with slowing his pace. He has taken NTG a few times with relief. No resting chest pain or pressure. He has an intermittent cough. No orthopnea, PND, leg edema, or claudication symptoms.   Past Medical History:  Diagnosis Date  . Allergy   . Bladder diverticulum   . CAD (coronary artery disease) cardiologist-  dr Siraj Dermody   a. S/P NSTEMI 10/20/11: LHC at [facility] in [place], [place]:  mRCA 70% and 80% hazy, dRCA 90%, EF 65%; >>  PCI: Promus DES x 2 (3x38 mm and 3x12 mm) to mid and distal RCA;  b.  LHC (6/14):  Mid LAD 40-50, ostial circumflex 30-40, mid and distal RCA stents patent, proximal PDA 40, EF 55%  . Cataract    removed bilaterally   . Cervical disc disease    C 6 L radiculopathy on L  . Cervical radiculopathy at C6    left side  . Gross hematuria   . Heart murmur    MVP  . History of adenomatous polyp of colon    05-17-2015  tubular  adenomas  . History of non-ST elevation myocardial infarction (NSTEMI)    10-20-2011  s/p  cardiac cath in [place], [place]  . HTN (hypertension)   . Hx of adenomatous colonic polyps 05/23/2015  . Hyperlipidemia   . Myocardial infarction [facility])    NSTEMI- 2 stents   . OA (osteoarthritis)   . Positive PPD   . S/P drug eluting coronary stent placement    10-21-2011  at [facility] in [place], [place]  DES x2 to distal and mid RCA  . Seasonal allergies   . Urgency of urination     Past Surgical History:  Procedure Laterality Date  . CATARACT EXTRACTION W/ INTRAOCULAR LENS  IMPLANT, BILATERAL  2014  and  2016  . COLONOSCOPY  last one 05-17-2015  . CORONARY ANGIOPLASTY WITH STENT PLACEMENT  10-21-2011  in [place], [place]   DES to mid and distal RCA  . CYSTOSCOPY WITH FULGERATION N/A 02/17/2016   Procedure: CYSTOSCOPY with fulgeration of bleeders;  Surgeon: Kathie Rhodes, Robert Owen;  Location: [facility];  Service: Urology;  Laterality: N/A;rigid and flexible   . INGUINAL HERNIA REPAIR Left 06/20/2015   Procedure: LEFT INGUINAL HERNIA REPAIR WITH MESH;  Surgeon: Rolm Bookbinder, Robert Owen;  Location: [facility];  Service: General;  Laterality: Left;  . INSERTION OF MESH  Left 06/20/2015   Procedure: INSERTION OF MESH;  Surgeon: Rolm Bookbinder, Robert Owen;  Location: [place];  Service: General;  Laterality: Left;  . LEFT HEART CATHETERIZATION WITH CORONARY ANGIOGRAM N/A 11/14/2012   Procedure: LEFT HEART CATHETERIZATION WITH CORONARY ANGIOGRAM;  Surgeon: Sherren Mocha, Robert Owen;  Location: [facility] CATH LAB;  Service: Cardiovascular;  Laterality: N/A;  patent mid and distal RCA stent's;  ostial CFX 30%,  mLAD 40-50%,  pPDA 40%,  ef55%  . TONSILLECTOMY AND ADENOIDECTOMY  child  . WISDOM TOOTH EXTRACTION  1972    Current Medications: Current Meds  Medication Sig  . aspirin 81 MG tablet Take 81 mg by mouth daily.  Marland Kitchen ezetimibe (ZETIA) 10 MG tablet Take 10 mg daily by mouth.    . metoprolol tartrate (LOPRESSOR) 50 MG tablet Take 1 tablet (50 mg total) by mouth 2 (two) times daily.  Marland Kitchen NITROSTAT 0.4 MG SL tablet PLACE 1 TABLET UNDER THE TONGUE EVERY 5 MINUTES AS NEEDED FOR CHEST PAIN.  . rosuvastatin (CRESTOR) 40 MG tablet Take 40 mg by mouth daily.  . [DISCONTINUED] esomeprazole (NEXIUM) 10 MG packet Take 10 mg by mouth daily before breakfast.  . [DISCONTINUED] metoprolol tartrate (LOPRESSOR) 25 MG tablet Take 1 tablet (25 mg total) by mouth 2 (two) times daily.     Allergies:   Eugenol   Social History   Socioeconomic History  . Marital status: Married    Spouse name: Not on file  . Number of children: Not on file  . Years of education: Not on file  . Highest education level: Not on file  Occupational History  . Not on file  Tobacco Use  . Smoking status: Never Smoker  . Smokeless tobacco: Never Used  Substance and Sexual Activity  . Alcohol use: Yes    Alcohol/week: 0.0 standard drinks    Comment: OCCASIONAL  . Drug use: No  . Sexual activity: Not on file  Other Topics Concern  . 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   Social Determinants of Health   Financial Resource Strain:   . Difficulty of Paying Living Expenses:   Food Insecurity:   . Worried About Charity fundraiser in the Last Year:   . Arboriculturist in the Last Year:   Transportation Needs:   . Film/video editor (Medical):   Marland Kitchen Lack of Transportation (Non-Medical):   Physical Activity:   . Days of Exercise per Week:   . Minutes of Exercise per Session:   Stress:   . Feeling of Stress :   Social Connections:   . Frequency of Communication with Friends and Family:   . Frequency of Social Gatherings with Friends and Family:   . Attends Religious Services:   . Active Member of Clubs or Organizations:   . Attends Archivist Meetings:   Marland Kitchen Marital Status:      Family History: The patient's family history includes Alcohol abuse in his father  and mother; Dementia in his paternal grandmother; Diabetes in his brother and sister; Heart attack (age of onset: 66) in his father; Rheum arthritis in his paternal grandfather. There is no history of Colon cancer, Colon polyps, Esophageal cancer, Rectal cancer, or Stomach cancer.  ROS:   Please see the history of present illness.    All other systems reviewed and are negative.  EKGs/Labs/Other Studies Reviewed:    The following studies were reviewed today: Cath 11/14/2012: Procedure: Left Heart Cath, Selective Coronary  Angiography, LV angiography  Indication: Known CAD, abnormal stress test                                   Procedural Details: The right wrist was prepped, draped, and anesthetized with 1% lidocaine. Using the modified Seldinger technique, a 5 French sheath was introduced into the right radial artery. 3 mg of verapamil was administered through the sheath, weight-based unfractionated heparin was administered intravenously. Standard Judkins catheters were used for selective coronary angiography and left ventriculography. A JR 5 catheter had to be used to engage the right coronary artery. Catheter exchanges were performed over an exchange length guidewire. There were no immediate procedural complications. A TR band was used for radial hemostasis at the completion of the procedure.  The patient was transferred to the post catheterization recovery area for further monitoring.  Procedural Findings: Hemodynamics: AO 116/46 with a mean of 75 LV 119/13  Coronary angiography: Coronary dominance: right  Left mainstem: The left main is patent. There is no significant obstructive disease. There is mild irregularity noted. There is mild calcification present.  Left anterior descending (LAD): The LAD is patent to the apex. The vessel is diffusely diseased but there are no areas of high-grade stenosis. The proximal LAD is patent with minor irregularity. The mid LAD beyond the first  diagonal branch has diffuse 40-50% stenosis. The diagonal branches are patent. Further down in the mid LAD and in the distal LAD there are no significant stenoses.  Left circumflex (LCx): The left circumflex is patent. There is 30-40% ostial stenosis. There is a large first OM with no significant stenosis. The second OM is smaller without significant disease. The continuation of the AV groove circumflex has no significant disease.  Right coronary artery (RCA): The right coronary artery is patent throughout its course. There is no obstructive disease proximally. There is minor regularity noted. The stented segment in the mid vessel has no significant in-stent restenosis. There is mild restenosis noted but it is clearly not flow obstructive. The distal vessel is widely patent. The PDA branch has mild disease in its proximal aspect of about 40%. The PLA branch is small with no significant disease.  Left ventriculography: Left ventricular systolic function is normal, LVEF is estimated at 55%, there is no significant mitral regurgitation   Final Conclusions:   1. Continued patency of the stented segment in the mid right coronary artery 2. Diffuse nonobstructive coronary artery disease as detailed above 3. Normal left ventricular systolic function  Recommendations: Continued medical therapy. The patient is out beyond 12 months from PCI and he can discontinue effient at low risk of stent thrombosis at this point.   EKG:  EKG is not ordered today.  The recent ekg from Dr Raul Del office is reviewed and shows NSR with sub-millimeter ST elevation inferiorly, not significantly changed from old tracings  Recent Labs: 10/23/2019: BUN 18; Creatinine, Ser 0.73; Hemoglobin 15.7; Platelets 186; Potassium 5.0; Sodium 142  Recent Lipid Panel    Component Value Date/Time   CHOL 154 06/30/2012 1414   TRIG 125.0 06/30/2012 1414   HDL 50.40 06/30/2012 1414   CHOLHDL 3 06/30/2012 1414   VLDL 25.0 06/30/2012  1414   LDLCALC 79 06/30/2012 1414    Physical Exam:    VS:  BP (!) 142/68   Pulse 79   Ht 5\' 7"  (1.702 m)   Wt 159 lb 9.6 oz (72.4 kg)  SpO2 98%   BMI 25.00 kg/m     Wt Readings from Last 3 Encounters:  10/23/19 159 lb 9.6 oz (72.4 kg)  05/28/18 166 lb (75.3 kg)  05/14/18 166 lb (75.3 kg)     GEN:  Well nourished, well developed in no acute distress HEENT: Normal NECK: No JVD; there is a left carotid bruit LYMPHATICS: No lymphadenopathy CARDIAC: RRR, no murmurs, rubs, gallops RESPIRATORY:  Clear to auscultation without rales, wheezing or rhonchi  ABDOMEN: Soft, non-tender, non-distended MUSCULOSKELETAL:  No edema; No deformity  SKIN: Warm and dry NEUROLOGIC:  Alert and oriented x 3 PSYCHIATRIC:  Normal affect   ASSESSMENT:    1. 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 Mixed hyperlipidemia   3. Essential hypertension   4. Coronary artery disease involving native coronary artery of native heart with other form of angina pectoris ([place])   5. Left carotid bruit    PLAN:    In order of problems listed above:  1. Now with recent onset exertional angina, with known CAD and prior RCA stenting, mild-moderate restenosis in 2014, CCS II symptoms. Recommend: start plavix 75 mg daily, increase metoprolol to 50 mg BID, schedule cardiac cath and possible PCI. I have reviewed the risks, indications, and alternatives to cardiac catheterization, possible angioplasty, and stenting with the patient. Risks include but are not limited to bleeding, infection, vascular injury, stroke, myocardial infection, arrhythmia, kidney injury, radiation-related injury in the case of prolonged fluoroscopy use, emergency cardiac surgery, and death. The patient understands the risks of serious complication is 1-2 in [id number] with diagnostic cardiac cath and 1-2% or less with angioplasty/stenting.  2. Continue high intensity statin. Reviewed most  recent labs 3. BP well-controlled. Known hx white coat HTN 4. As above 5. Check carotid duplex. Continue antiplatelet, statin Rx   Medication Adjustments/Labs and Tests Ordered: Current medicines are reviewed at length with the patient today.  Concerns regarding medicines are outlined above.  Orders Placed This Encounter  Procedures  . Basic metabolic panel  . CBC with Differential/Platelet  . VAS US CAROTID   Meds ordered this encounter  Medications  . clopidogrel (PLAVIX) 75 MG tablet    Sig: Take 1 tablet (75 mg total) by mouth daily.    Dispense:  90 tablet    Refill:  3  . metoprolol tartrate (LOPRESSOR) 50 MG tablet    Sig: Take 1 tablet (50 mg total) by mouth 2 (two) times daily.    Dispense:  180 tablet    Refill:  3  . pantoprazole (PROTONIX) 40 MG tablet    Sig: Take 1 tablet (40 mg total) by mouth daily.    Dispense:  90 tablet    Refill:  3    Patient Instructions  Medication Instructions:  1) INCREASE METOPROLOL to 50 mg twice daily 2) START PLAVIX 75 mg daily 3) STOP NEXIUM 4) START PANTOPRAZOLE 40 mg daily *If you need a refill on your cardiac medications before your next appointment, please call your pharmacy*   COVID SCREENING INFORMATION: Make sure you bring your COVID test results with you to your cath!   CATHETERIZATION INSTRUCTIONS (5/20): You are scheduled for a Cardiac Catheterization on Thursday, May 20 with Dr. Sherren Mocha.  1. Please arrive at the [facility] (Main Entrance A) at [facility]: [address] [place], [place] [address] at 10:00AM (This time is two hours before your procedure to ensure your preparation). Free  valet parking service is available. You are allowed ONE visitor in the waiting room during your procedure. Both you and your guest must wear masks. Special note: Every effort is made to have your procedure done on time. Please understand that emergencies sometimes delay scheduled procedures.  2. Diet: Do not eat  solid foods after midnight.  The patient may have clear liquids until 5am upon the day of the procedure.  3. Labs: TODAY!  4. Medication instructions in preparation for your procedure:  1) MAKE SURE TO TAKE YOUR ASPIRIN AND PLAVIX the morning of your cath  2) You may take your other meds as directed with sips of water   5. Plan for one night stay--bring personal belongings. 6. Bring a current list of your medications and current insurance cards. 7. You MUST have a responsible person to drive you home. 8. Someone MUST be with you the first 24 hours after you arrive home or your discharge will be delayed. 9. Please wear clothes that are easy to get on and off and wear slip-on shoes.  Thank you for allowing [place] to care for you!   -- [facility] Cardiovascular services     Signed, Sherren Mocha, Robert Owen  10/24/2019 3:09 PM    [facility]

## 2019-10-23 NOTE — H&P (View-Only) (Signed)
Cardiology Office Note:    Date:  10/24/2019   ID:  Robert Limes, Robert Owen, DOB Aug 26, 1945, MRN [id number]  PCP:  Marton Redwood, Robert Owen  Cardiologist:  Sherren Mocha, Robert Owen  Electrophysiologist:  None   Referring Robert Owen: Marton Redwood, Robert Owen   Chief Complaint  Patient presents with  . Coronary Artery Disease    History of Present Illness:    Robert Limes, Robert Owen is a 74 y.o. male with a hx of coronary artery disease.  The patient initially presented in 2013 with a non-ST elevation infarction.  He was found to have severe stenosis in the right coronary artery and was treated with drug-eluting stent platforms.  Repeat heart catheterization in 2014 demonstrated patency of his stent sites and moderate nonobstructive disease elsewhere.  Robert Owen is here alone today. He has been experiencing exertional chest pressure and shortness of breath for 4-6 weeks, worse in cold weather. This occurs when he walks for exercise, especially when going up hill. There is a pressure like sensation in the center of his chest associated with dyspnea. Chest pain resolves with slowing his pace. He has taken NTG a few times with relief. No resting chest pain or pressure. He has an intermittent cough. No orthopnea, PND, leg edema, or claudication symptoms.   Past Medical History:  Diagnosis Date  . Allergy   . Bladder diverticulum   . CAD (coronary artery disease) cardiologist-  dr Demoni Parmar   a. S/P NSTEMI 10/20/11: LHC at [facility] in [place], [place]:  mRCA 70% and 80% hazy, dRCA 90%, EF 65%; >>  PCI: Promus DES x 2 (3x38 mm and 3x12 mm) to mid and distal RCA;  b.  LHC (6/14):  Mid LAD 40-50, ostial circumflex 30-40, mid and distal RCA stents patent, proximal PDA 40, EF 55%  . Cataract    removed bilaterally   . Cervical disc disease    C 6 L radiculopathy on L  . Cervical radiculopathy at C6    left side  . Gross hematuria   . Heart murmur    MVP  . History of adenomatous polyp of colon    05-17-2015  tubular  adenomas  . History of non-ST elevation myocardial infarction (NSTEMI)    10-20-2011  s/p  cardiac cath in [place], [place]  . HTN (hypertension)   . Hx of adenomatous colonic polyps 05/23/2015  . Hyperlipidemia   . Myocardial infarction [facility])    NSTEMI- 2 stents   . OA (osteoarthritis)   . Positive PPD   . S/P drug eluting coronary stent placement    10-21-2011  at [facility] in [place], [place]  DES x2 to distal and mid RCA  . Seasonal allergies   . Urgency of urination     Past Surgical History:  Procedure Laterality Date  . CATARACT EXTRACTION W/ INTRAOCULAR LENS  IMPLANT, BILATERAL  2014  and  2016  . COLONOSCOPY  last one 05-17-2015  . CORONARY ANGIOPLASTY WITH STENT PLACEMENT  10-21-2011  in [place], [place]   DES to mid and distal RCA  . CYSTOSCOPY WITH FULGERATION N/A 02/17/2016   Procedure: CYSTOSCOPY with fulgeration of bleeders;  Surgeon: Kathie Rhodes, Robert Owen;  Location: [facility];  Service: Urology;  Laterality: N/A;rigid and flexible   . INGUINAL HERNIA REPAIR Left 06/20/2015   Procedure: LEFT INGUINAL HERNIA REPAIR WITH MESH;  Surgeon: Rolm Bookbinder, Robert Owen;  Location: [place];  Service: General;  Laterality: Left;  . INSERTION OF MESH  Left 06/20/2015   Procedure: INSERTION OF MESH;  Surgeon: Rolm Bookbinder, Robert Owen;  Location: [place];  Service: General;  Laterality: Left;  . LEFT HEART CATHETERIZATION WITH CORONARY ANGIOGRAM N/A 11/14/2012   Procedure: LEFT HEART CATHETERIZATION WITH CORONARY ANGIOGRAM;  Surgeon: Sherren Mocha, Robert Owen;  Location: [facility] CATH LAB;  Service: Cardiovascular;  Laterality: N/A;  patent mid and distal RCA stent's;  ostial CFX 30%,  mLAD 40-50%,  pPDA 40%,  ef55%  . TONSILLECTOMY AND ADENOIDECTOMY  child  . WISDOM TOOTH EXTRACTION  1972    Current Medications: Current Meds  Medication Sig  . aspirin 81 MG tablet Take 81 mg by mouth daily.  Marland Kitchen ezetimibe (ZETIA) 10 MG tablet Take 10 mg daily by mouth.    . metoprolol tartrate (LOPRESSOR) 50 MG tablet Take 1 tablet (50 mg total) by mouth 2 (two) times daily.  Marland Kitchen NITROSTAT 0.4 MG SL tablet PLACE 1 TABLET UNDER THE TONGUE EVERY 5 MINUTES AS NEEDED FOR CHEST PAIN.  . rosuvastatin (CRESTOR) 40 MG tablet Take 40 mg by mouth daily.  . [DISCONTINUED] esomeprazole (NEXIUM) 10 MG packet Take 10 mg by mouth daily before breakfast.  . [DISCONTINUED] metoprolol tartrate (LOPRESSOR) 25 MG tablet Take 1 tablet (25 mg total) by mouth 2 (two) times daily.     Allergies:   Eugenol   Social History   Socioeconomic History  . Marital status: Married    Spouse name: Not on file  . Number of children: Not on file  . Years of education: Not on file  . Highest education level: Not on file  Occupational History  . Not on file  Tobacco Use  . Smoking status: Never Smoker  . Smokeless tobacco: Never Used  Substance and Sexual Activity  . Alcohol use: Yes    Alcohol/week: 0.0 standard drinks    Comment: OCCASIONAL  . Drug use: No  . Sexual activity: Not on file  Other Topics Concern  . 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   Social Determinants of Health   Financial Resource Strain:   . Difficulty of Paying Living Expenses:   Food Insecurity:   . Worried About Charity fundraiser in the Last Year:   . Arboriculturist in the Last Year:   Transportation Needs:   . Film/video editor (Medical):   Marland Kitchen Lack of Transportation (Non-Medical):   Physical Activity:   . Days of Exercise per Week:   . Minutes of Exercise per Session:   Stress:   . Feeling of Stress :   Social Connections:   . Frequency of Communication with Friends and Family:   . Frequency of Social Gatherings with Friends and Family:   . Attends Religious Services:   . Active Member of Clubs or Organizations:   . Attends Archivist Meetings:   Marland Kitchen Marital Status:      Family History: The patient's family history includes Alcohol abuse in his father  and mother; Dementia in his paternal grandmother; Diabetes in his brother and sister; Heart attack (age of onset: 30) in his father; Rheum arthritis in his paternal grandfather. There is no history of Colon cancer, Colon polyps, Esophageal cancer, Rectal cancer, or Stomach cancer.  ROS:   Please see the history of present illness.    All other systems reviewed and are negative.  EKGs/Labs/Other Studies Reviewed:    The following studies were reviewed today: Cath 11/14/2012: Procedure: Left Heart Cath, Selective Coronary  Angiography, LV angiography  Indication: Known CAD, abnormal stress test                                   Procedural Details: The right wrist was prepped, draped, and anesthetized with 1% lidocaine. Using the modified Seldinger technique, a 5 French sheath was introduced into the right radial artery. 3 mg of verapamil was administered through the sheath, weight-based unfractionated heparin was administered intravenously. Standard Judkins catheters were used for selective coronary angiography and left ventriculography. A JR 5 catheter had to be used to engage the right coronary artery. Catheter exchanges were performed over an exchange length guidewire. There were no immediate procedural complications. A TR band was used for radial hemostasis at the completion of the procedure.  The patient was transferred to the post catheterization recovery area for further monitoring.  Procedural Findings: Hemodynamics: AO 116/46 with a mean of 75 LV 119/13  Coronary angiography: Coronary dominance: right  Left mainstem: The left main is patent. There is no significant obstructive disease. There is mild irregularity noted. There is mild calcification present.  Left anterior descending (LAD): The LAD is patent to the apex. The vessel is diffusely diseased but there are no areas of high-grade stenosis. The proximal LAD is patent with minor irregularity. The mid LAD beyond the first  diagonal branch has diffuse 40-50% stenosis. The diagonal branches are patent. Further down in the mid LAD and in the distal LAD there are no significant stenoses.  Left circumflex (LCx): The left circumflex is patent. There is 30-40% ostial stenosis. There is a large first OM with no significant stenosis. The second OM is smaller without significant disease. The continuation of the AV groove circumflex has no significant disease.  Right coronary artery (RCA): The right coronary artery is patent throughout its course. There is no obstructive disease proximally. There is minor regularity noted. The stented segment in the mid vessel has no significant in-stent restenosis. There is mild restenosis noted but it is clearly not flow obstructive. The distal vessel is widely patent. The PDA branch has mild disease in its proximal aspect of about 40%. The PLA branch is small with no significant disease.  Left ventriculography: Left ventricular systolic function is normal, LVEF is estimated at 55%, there is no significant mitral regurgitation   Final Conclusions:   1. Continued patency of the stented segment in the mid right coronary artery 2. Diffuse nonobstructive coronary artery disease as detailed above 3. Normal left ventricular systolic function  Recommendations: Continued medical therapy. The patient is out beyond 12 months from PCI and he can discontinue effient at low risk of stent thrombosis at this point.   EKG:  EKG is not ordered today.  The recent ekg from Dr Raul Del office is reviewed and shows NSR with sub-millimeter ST elevation inferiorly, not significantly changed from old tracings  Recent Labs: 10/23/2019: BUN 18; Creatinine, Ser 0.73; Hemoglobin 15.7; Platelets 186; Potassium 5.0; Sodium 142  Recent Lipid Panel    Component Value Date/Time   CHOL 154 06/30/2012 1414   TRIG 125.0 06/30/2012 1414   HDL 50.40 06/30/2012 1414   CHOLHDL 3 06/30/2012 1414   VLDL 25.0 06/30/2012  1414   LDLCALC 79 06/30/2012 1414    Physical Exam:    VS:  BP (!) 142/68   Pulse 79   Ht 5\' 7"  (1.702 m)   Wt 159 lb 9.6 oz (72.4 kg)  SpO2 98%   BMI 25.00 kg/m     Wt Readings from Last 3 Encounters:  10/23/19 159 lb 9.6 oz (72.4 kg)  05/28/18 166 lb (75.3 kg)  05/14/18 166 lb (75.3 kg)     GEN:  Well nourished, well developed in no acute distress HEENT: Normal NECK: No JVD; there is a left carotid bruit LYMPHATICS: No lymphadenopathy CARDIAC: RRR, no murmurs, rubs, gallops RESPIRATORY:  Clear to auscultation without rales, wheezing or rhonchi  ABDOMEN: Soft, non-tender, non-distended MUSCULOSKELETAL:  No edema; No deformity  SKIN: Warm and dry NEUROLOGIC:  Alert and oriented x 3 PSYCHIATRIC:  Normal affect   ASSESSMENT:    1. Coronary artery disease involving native coronary artery of native heart with angina pectoris ([place])   2. Mixed hyperlipidemia   3. Essential hypertension   4. Coronary artery disease involving native coronary artery of native heart with other form of angina pectoris ([place])   5. Left carotid bruit    PLAN:    In order of problems listed above:  1. Now with recent onset exertional angina, with known CAD and prior RCA stenting, mild-moderate restenosis in 2014, CCS II symptoms. Recommend: start plavix 75 mg daily, increase metoprolol to 50 mg BID, schedule cardiac cath and possible PCI. I have reviewed the risks, indications, and alternatives to cardiac catheterization, possible angioplasty, and stenting with the patient. Risks include but are not limited to bleeding, infection, vascular injury, stroke, myocardial infection, arrhythmia, kidney injury, radiation-related injury in the case of prolonged fluoroscopy use, emergency cardiac surgery, and death. The patient understands the risks of serious complication is 1-2 in [id number] with diagnostic cardiac cath and 1-2% or less with angioplasty/stenting.  2. Continue high intensity statin. Reviewed most  recent labs 3. BP well-controlled. Known hx white coat HTN 4. As above 5. Check carotid duplex. Continue antiplatelet, statin Rx   Medication Adjustments/Labs and Tests Ordered: Current medicines are reviewed at length with the patient today.  Concerns regarding medicines are outlined above.  Orders Placed This Encounter  Procedures  . Basic metabolic panel  . CBC with Differential/Platelet  . VAS US CAROTID   Meds ordered this encounter  Medications  . clopidogrel (PLAVIX) 75 MG tablet    Sig: Take 1 tablet (75 mg total) by mouth daily.    Dispense:  90 tablet    Refill:  3  . metoprolol tartrate (LOPRESSOR) 50 MG tablet    Sig: Take 1 tablet (50 mg total) by mouth 2 (two) times daily.    Dispense:  180 tablet    Refill:  3  . pantoprazole (PROTONIX) 40 MG tablet    Sig: Take 1 tablet (40 mg total) by mouth daily.    Dispense:  90 tablet    Refill:  3    Patient Instructions  Medication Instructions:  1) INCREASE METOPROLOL to 50 mg twice daily 2) START PLAVIX 75 mg daily 3) STOP NEXIUM 4) START PANTOPRAZOLE 40 mg daily *If you need a refill on your cardiac medications before your next appointment, please call your pharmacy*   COVID SCREENING INFORMATION: Make sure you bring your COVID test results with you to your cath!   CATHETERIZATION INSTRUCTIONS (5/20): You are scheduled for a Cardiac Catheterization on Thursday, May 20 with Dr. Sherren Mocha.  1. Please arrive at the [facility] (Main Entrance A) at [facility]: [address] [place], [place] [address] at 10:00AM (This time is two hours before your procedure to ensure your preparation). Free  valet parking service is available. You are allowed ONE visitor in the waiting room during your procedure. Both you and your guest must wear masks. Special note: Every effort is made to have your procedure done on time. Please understand that emergencies sometimes delay scheduled procedures.  2. Diet: Do not eat  solid foods after midnight.  The patient may have clear liquids until 5am upon the day of the procedure.  3. Labs: TODAY!  4. Medication instructions in preparation for your procedure:  1) MAKE SURE TO TAKE YOUR ASPIRIN AND PLAVIX the morning of your cath  2) You may take your other meds as directed with sips of water   5. Plan for one night stay--bring personal belongings. 6. Bring a current list of your medications and current insurance cards. 7. You MUST have a responsible person to drive you home. 8. Someone MUST be with you the first 24 hours after you arrive home or your discharge will be delayed. 9. Please wear clothes that are easy to get on and off and wear slip-on shoes.  Thank you for allowing [place] to care for you!   -- [facility] Invasive Cardiovascular services     Signed, Sherren Mocha, Robert Owen  10/24/2019 3:09 PM    [facility]

## 2019-10-23 NOTE — Patient Instructions (Addendum)
Medication Instructions:  1) INCREASE METOPROLOL to 50 mg twice daily 2) START PLAVIX 75 mg daily 3) STOP NEXIUM 4) START PANTOPRAZOLE 40 mg daily *If you need a refill on your cardiac medications before your next appointment, please call your pharmacy*   COVID SCREENING INFORMATION: Make sure you bring your COVID test results with you to your cath!   CATHETERIZATION INSTRUCTIONS (5/20): You are scheduled for a Cardiac Catheterization on Thursday, May 20 with Dr. Sherren Mocha.  1. Please arrive at the [facility] (Main Entrance A) at [facility]: [address] [place], [place] [address] at 10:00AM (This time is two hours before your procedure to ensure your preparation). Free valet parking service is available. You are allowed ONE visitor in the waiting room during your procedure. Both you and your guest must wear masks. Special note: Every effort is made to have your procedure done on time. Please understand that emergencies sometimes delay scheduled procedures.  2. Diet: Do not eat solid foods after midnight.  The patient may have clear liquids until 5am upon the day of the procedure.  3. Labs: TODAY!  4. Medication instructions in preparation for your procedure:  1) MAKE SURE TO TAKE YOUR ASPIRIN AND PLAVIX the morning of your cath  2) You may take your other meds as directed with sips of water   5. Plan for one night stay--bring personal belongings. 6. Bring a current list of your medications and current insurance cards. 7. You MUST have a responsible person to drive you home. 8. Someone MUST be with you the first 24 hours after you arrive home or your discharge will be delayed. 9. Please wear clothes that are easy to get on and off and wear slip-on shoes.  Thank you for allowing [place] to care for you!   -- [place] Invasive Cardiovascular services

## 2019-10-28 ENCOUNTER — Telehealth: Payer: Self-pay | Admitting: *Deleted

## 2019-10-28 NOTE — Telephone Encounter (Addendum)
Pt contacted pre-catheterization scheduled at [facility] for: Thursday Oct 29, 2019 12 noon Verified arrival time and place: [facility]) at: 10 AM   No solid food after midnight prior to cath, clear liquids until 5 AM day of procedure.   AM meds can be  taken pre-cath with sip of water including: ASA 81 mg Plavix 75 mg   Confirmed patient has responsible adult to drive home post procedure and observe 24 hours after arriving home: yes  You are allowed ONE visitor in the waiting room during your procedure. Both you and your visitor must wear masks.      COVID-19 Pre-Screening Questions:  . In the past 7 to 10 days have you had a cough,  shortness of breath, headache, congestion, fever (100 or greater) body aches, chills, sore throat, or sudden loss of taste or sense of smell? no . Have you been around anyone with known Covid 19 in the past 7 to 10 days? no  Reviewed procedure/mask/visitor instructions, COVID-19 screening questions with patient.  Pt states he had COVID test at [facility] yesterday sent to [employer] and he will have rapid today and bring copy of these results with him to [facility] tomorrow.

## 2019-10-29 ENCOUNTER — Ambulatory Visit (HOSPITAL_COMMUNITY)
Admission: RE | Admit: 2019-10-29 | Discharge: 2019-10-29 | Disposition: A | Discharge status: Home / Self Care | Payer: PPO | Attending: Cardiovascular Disease | Admitting: Cardiovascular Disease

## 2019-10-29 ENCOUNTER — Other Ambulatory Visit: Payer: Self-pay

## 2019-10-29 ENCOUNTER — Ambulatory Visit (HOSPITAL_COMMUNITY)
Admission: RE | Disposition: A | Discharge status: Home / Self Care | Payer: Self-pay | Source: Home / Self Care | Attending: Cardiovascular Disease

## 2019-10-29 DIAGNOSIS — I25118 Atherosclerotic heart disease of native coronary artery with other forms of angina pectoris: Secondary | ICD-10-CM

## 2019-10-29 DIAGNOSIS — Z79899 Other long term (current) drug therapy: Secondary | ICD-10-CM | POA: Insufficient documentation

## 2019-10-29 DIAGNOSIS — E782 Mixed hyperlipidemia: Secondary | ICD-10-CM | POA: Diagnosis not present

## 2019-10-29 DIAGNOSIS — Z7982 Long term (current) use of aspirin: Secondary | ICD-10-CM | POA: Insufficient documentation

## 2019-10-29 DIAGNOSIS — I25119 Atherosclerotic heart disease of native coronary artery with unspecified angina pectoris: Secondary | ICD-10-CM | POA: Diagnosis not present

## 2019-10-29 DIAGNOSIS — I10 Essential (primary) hypertension: Secondary | ICD-10-CM | POA: Diagnosis not present

## 2019-10-29 DIAGNOSIS — I252 Old myocardial infarction: Secondary | ICD-10-CM | POA: Insufficient documentation

## 2019-10-29 DIAGNOSIS — Z955 Presence of coronary angioplasty implant and graft: Secondary | ICD-10-CM | POA: Diagnosis not present

## 2019-10-29 DIAGNOSIS — R0989 Other specified symptoms and signs involving the circulatory and respiratory systems: Secondary | ICD-10-CM | POA: Insufficient documentation

## 2019-10-29 DIAGNOSIS — I2510 Atherosclerotic heart disease of native coronary artery without angina pectoris: Secondary | ICD-10-CM | POA: Diagnosis present

## 2019-10-29 HISTORY — PX: LEFT HEART CATH AND CORONARY ANGIOGRAPHY: CATH118249

## 2019-10-29 HISTORY — PX: CORONARY STENT INTERVENTION: CATH118234

## 2019-10-29 LAB — POCT ACTIVATED CLOTTING TIME
Activated Clotting Time: 246 seconds
Activated Clotting Time: 329 seconds

## 2019-10-29 SURGERY — LEFT HEART CATH AND CORONARY ANGIOGRAPHY
Anesthesia: LOCAL

## 2019-10-29 MED ORDER — SODIUM CHLORIDE 0.9 % WEIGHT BASED INFUSION: 1.0000 mL/kg/h | INTRAVENOUS | Status: DC

## 2019-10-29 MED ORDER — ASPIRIN 81 MG PO CHEW: 81.0000 mg | CHEWABLE_TABLET | ORAL | Status: DC

## 2019-10-29 MED ORDER — FENTANYL CITRATE (PF) 100 MCG/2ML IJ SOLN
INTRAMUSCULAR | Status: DC | PRN
  Administered 2019-10-29: 25 ug via INTRAVENOUS

## 2019-10-29 MED ORDER — FENTANYL CITRATE (PF) 100 MCG/2ML IJ SOLN
INTRAMUSCULAR | Status: AC
  Filled 2019-10-29: qty 2

## 2019-10-29 MED ORDER — CLOPIDOGREL BISULFATE 75 MG PO TABS: 75.0000 mg | ORAL_TABLET | ORAL | Status: DC

## 2019-10-29 MED ORDER — SODIUM CHLORIDE 0.9 % WEIGHT BASED INFUSION
3.0000 mL/kg/h | INTRAVENOUS | Status: AC
  Administered 2019-10-29: 3 mL/kg/h via INTRAVENOUS

## 2019-10-29 MED ORDER — LABETALOL HCL 5 MG/ML IV SOLN: 10.0000 mg | INTRAVENOUS | Status: DC | PRN

## 2019-10-29 MED ORDER — MIDAZOLAM HCL 2 MG/2ML IJ SOLN
INTRAMUSCULAR | Status: DC | PRN
  Administered 2019-10-29: 2 mg via INTRAVENOUS

## 2019-10-29 MED ORDER — VERAPAMIL HCL 2.5 MG/ML IV SOLN
INTRAVENOUS | Status: AC
  Filled 2019-10-29: qty 2

## 2019-10-29 MED ORDER — HYDRALAZINE HCL 20 MG/ML IJ SOLN: 10.0000 mg | INTRAMUSCULAR | Status: DC | PRN

## 2019-10-29 MED ORDER — VERAPAMIL HCL 2.5 MG/ML IV SOLN
INTRAVENOUS | Status: DC | PRN
  Administered 2019-10-29: 10 mL via INTRA_ARTERIAL

## 2019-10-29 MED ORDER — SODIUM CHLORIDE 0.9% FLUSH: 3.0000 mL | INTRAVENOUS | Status: DC | PRN

## 2019-10-29 MED ORDER — LIDOCAINE HCL (PF) 1 % IJ SOLN
INTRAMUSCULAR | Status: AC
  Filled 2019-10-29: qty 30

## 2019-10-29 MED ORDER — ACETAMINOPHEN 325 MG PO TABS: 650.0000 mg | ORAL_TABLET | ORAL | Status: DC | PRN

## 2019-10-29 MED ORDER — MIDAZOLAM HCL 2 MG/2ML IJ SOLN
INTRAMUSCULAR | Status: AC
  Filled 2019-10-29: qty 2

## 2019-10-29 MED ORDER — ONDANSETRON HCL 4 MG/2ML IJ SOLN: 4.0000 mg | Freq: Four times a day (QID) | INTRAMUSCULAR | Status: DC | PRN

## 2019-10-29 MED ORDER — HEPARIN SODIUM (PORCINE) 1000 UNIT/ML IJ SOLN
INTRAMUSCULAR | Status: DC | PRN
  Administered 2019-10-29 (×2): 3000 [IU] via INTRAVENOUS
  Administered 2019-10-29: 4000 [IU] via INTRAVENOUS

## 2019-10-29 MED ORDER — SODIUM CHLORIDE 0.9 % IV SOLN: 250.0000 mL | INTRAVENOUS | Status: DC | PRN

## 2019-10-29 MED ORDER — HEPARIN (PORCINE) IN NACL 1000-0.9 UT/500ML-% IV SOLN
INTRAVENOUS | Status: DC | PRN
  Administered 2019-10-29 (×2): 500 mL

## 2019-10-29 MED ORDER — HEPARIN SODIUM (PORCINE) 1000 UNIT/ML IJ SOLN
INTRAMUSCULAR | Status: AC
  Filled 2019-10-29: qty 1

## 2019-10-29 MED ORDER — IOHEXOL 350 MG/ML SOLN
INTRAVENOUS | Status: DC | PRN
  Administered 2019-10-29: 95 mL

## 2019-10-29 MED ORDER — SODIUM CHLORIDE 0.9% FLUSH: 3.0000 mL | Freq: Two times a day (BID) | INTRAVENOUS | Status: DC

## 2019-10-29 MED ORDER — LIDOCAINE HCL (PF) 1 % IJ SOLN
INTRAMUSCULAR | Status: DC | PRN
  Administered 2019-10-29: 2 mL

## 2019-10-29 MED ORDER — NITROGLYCERIN 1 MG/10 ML FOR IR/CATH LAB
INTRA_ARTERIAL | Status: AC
  Filled 2019-10-29: qty 10

## 2019-10-29 SURGICAL SUPPLY — 17 items
BALLN SAPPHIRE 2.5X15 (BALLOONS) ×2
BALLN SAPPHIRE ~~LOC~~ 3.5X15 (BALLOONS) ×1 IMPLANT
BALLOON SAPPHIRE 2.5X15 (BALLOONS) IMPLANT
CATH IMPULSE 5F ANG/FL3.5 (CATHETERS) ×1 IMPLANT
CATH INFINITI 5 FR JR5 (CATHETERS) ×1 IMPLANT
CATH LAUNCHER 6FR AL.75 (CATHETERS) ×1 IMPLANT
DEVICE RAD COMP TR BAND LRG (VASCULAR PRODUCTS) ×1 IMPLANT
GLIDESHEATH SLEND SS 6F .021 (SHEATH) ×1 IMPLANT
GUIDEWIRE INQWIRE 1.5J.035X260 (WIRE) IMPLANT
INQWIRE 1.5J .035X260CM (WIRE) ×2
KIT ENCORE 26 ADVANTAGE (KITS) ×1 IMPLANT
KIT HEART LEFT (KITS) ×2 IMPLANT
PACK CARDIAC CATHETERIZATION (CUSTOM PROCEDURE TRAY) ×2 IMPLANT
STENT RESOLUTE ONYX 3.0X34 (Permanent Stent) ×1 IMPLANT
TRANSDUCER W/STOPCOCK (MISCELLANEOUS) ×2 IMPLANT
TUBING CIL FLEX 10 FLL-RA (TUBING) ×2 IMPLANT
WIRE COUGAR XT STRL 190CM (WIRE) ×1 IMPLANT

## 2019-10-29 NOTE — Interval H&P Note (Signed)
Cath Lab Visit (complete for each Cath Lab visit)  Clinical Evaluation Leading to the Procedure:   ACS: Yes.    Non-ACS:    Anginal Classification: CCS II  Anti-ischemic medical therapy: Minimal Therapy (1 class of medications)  Non-Invasive Test Results: No non-invasive testing performed  Prior CABG: No previous CABG      History and Physical Interval Note:  10/29/2019 11:30 AM  [name], MD  has presented today for surgery, with the diagnosis of cad - angina.  The various methods of treatment have been discussed with the patient and family. After consideration of risks, benefits and other options for treatment, the patient has consented to  Procedure(s): LEFT HEART CATH AND CORONARY ANGIOGRAPHY (N/A) as a surgical intervention.  The patient's history has been reviewed, patient examined, no change in status, stable for surgery.  I have reviewed the patient's chart and labs.  Questions were answered to the patient's satisfaction.     Sherren Mocha

## 2019-10-29 NOTE — Progress Notes (Signed)
Discussed stent, restrictions, Plavix, diet, exercise, NTG, and CRPII. Very receptive and has continued to apply CRPII principles to his lifestyle from previous MI. Will refer to CRPII [place], he is most interested in virtual program.  Pt is interested in participating in Virtual Cardiac and Pulmonary Rehab. Pt advised that Virtual Cardiac and Pulmonary Rehab is provided at no cost to the patient.  Checklist:  1. Pt has smart device  ie smartphone and/or ipad for downloading an app  Yes 2. Reliable internet/wifi service    Yes 3. Understands how to use their smartphone and navigate within an app.  Yes Pt verbalized understanding and is in agreement. [place], ACSM 2:28 PM 10/29/2019

## 2019-10-29 NOTE — Discharge Instructions (Signed)
Radial Site Care  This sheet gives you information about how to care for yourself after your procedure. Your health care provider may also give you more specific instructions. If you have problems or questions, contact your health care provider. What can I expect after the procedure? After the procedure, it is common to have:  Bruising and tenderness at the catheter insertion area. Follow these instructions at home: Medicines  Take over-the-counter and prescription medicines only as told by your health care provider. Insertion site care  Follow instructions from your health care provider about how to take care of your insertion site. Make sure you: ? Wash your hands with soap and water before you change your bandage (dressing). If soap and water are not available, use hand sanitizer. ? Change your dressing as told by your health care provider. ? Leave stitches (sutures), skin glue, or adhesive strips in place. These skin closures may need to stay in place for 2 weeks or longer. If adhesive strip edges start to loosen and curl up, you may trim the loose edges. Do not remove adhesive strips completely unless your health care provider tells you to do that.  Check your insertion site every day for signs of infection. Check for: ? Redness, swelling, or pain. ? Fluid or blood. ? Pus or a bad smell. ? Warmth.  Do not take baths, swim, or use a hot tub until your health care provider approves.  You may shower 24-48 hours after the procedure, or as directed by your health care provider. ? Remove the dressing and gently wash the site with plain soap and water. ? Pat the area dry with a clean towel. ? Do not rub the site. That could cause bleeding.  Do not apply powder or lotion to the site. Activity   For 24 hours after the procedure, or as directed by your health care provider: ? Do not flex or bend the affected arm. ? Do not push or pull heavy objects with the affected arm. ? Do not  drive yourself home from the hospital or clinic. You may drive 24 hours after the procedure unless your health care provider tells you not to. ? Do not operate machinery or power tools.  Do not lift anything that is heavier than 10 lb (4.5 kg), or the limit that you are told, until your health care provider says that it is safe.  Ask your health care provider when it is okay to: ? Return to work or school. ? Resume usual physical activities or sports. ? Resume sexual activity. General instructions  If the catheter site starts to bleed, raise your arm and put firm pressure on the site. If the bleeding does not stop, get help right away. This is a medical emergency.  If you went home on the same day as your procedure, a responsible adult should be with you for the first 24 hours after you arrive home.  Keep all follow-up visits as told by your health care provider. This is important. Contact a health care provider if:  You have a fever.  You have redness, swelling, or yellow drainage around your insertion site. Get help right away if:  You have unusual pain at the radial site.  The catheter insertion area swells very fast.  The insertion area is bleeding, and the bleeding does not stop when you hold steady pressure on the area.  Your arm or hand becomes pale, cool, tingly, or numb. These symptoms may represent a serious problem   that is an emergency. Do not wait to see if the symptoms will go away. Get medical help right away. Call your local emergency services (911 in the [place] Do not drive yourself to the hospital. Summary  After the procedure, it is common to have bruising and tenderness at the site.  Follow instructions from your health care provider about how to take care of your radial site wound. Check the wound every day for signs of infection.  Do not lift anything that is heavier than 10 lb (4.5 kg), or the limit that you are told, until your health care provider says  that it is safe. This information is not intended to replace advice given to you by your health care provider. Make sure you discuss any questions you have with your health care provider. Document Revised: 07/03/2017 Document Reviewed: 07/03/2017 Elsevier Patient Education  2020 [employer].  

## 2019-10-29 NOTE — Discharge Summary (Signed)
Discharge Summary for Same Day PCI   Patient ID: Robert Limes, Robert Owen MRN: [id number]; DOB: 1946/04/24  Admit date: 10/29/2019 Discharge date: 10/29/2019  Primary Care Provider: Marton Redwood, Robert Owen  Primary Cardiologist: Sherren Mocha, Robert Owen  Primary Electrophysiologist:  None   Discharge Diagnoses    Active Problems:   Coronary artery disease involving native coronary artery of native heart with angina pectoris [facility])   HTN   HLD   Diagnostic Studies/Procedures    Cardiac Catheterization 10/29/2019:  CORONARY STENT INTERVENTION  LEFT HEART CATH AND CORONARY ANGIOGRAPHY  Conclusion  1.  Severe stenosis involving the ostium and proximal portions of the RCA, treated successfully with PCI using a 3.0 x 34 mm resolute Onyx DES 2.  Continued patency of the stented segment in the mid RCA 3.  Mild to moderate diffuse stenosis in the LAD and left circumflex, unchanged from the previous cardiac cath study. 4.  Mild hypokinesis of the inferoapical LV segments, otherwise normal LV contractility with LVEF 50 to 55%  Recommendations: Dual antiplatelet therapy with aspirin and clopidogrel for 12 months as tolerated.  Continued aggressive risk reduction measures.   Diagnostic Dominance: Right  Intervention      History of Present Illness     Robert Limes, Robert Owen is a 74 y.o. male with hx of CADS/p DES to RCA in 2013 with repeat cath in 2014 showed paten stent otherwise non obstructive disease, HTN, HLD and L carotid bruit presented for cath.   Seen by Dr. Burt Knack 10/23/19 for exertional chest pressure and shortness of breath 4-6 weeks. Worsen when going uphill. Feels like pressure sensation and dyspnea. Chest pain resolves with slowing his pace. He has taken NTG a few times with relief. No resting chest pain or pressure. Given symptoms concerning for angina, cardiac catheterization was arranged for further evaluation.  Hospital Course     The patient underwent cardiac cath as noted above  with severe stenosis involving the ostium and proximal portions of the RCA, treated successfully with PCI using a 3.0 x 34 mm resolute Onyx DES. Patent mRCA stent. Mild to moderate diffuse LAD and left circumflex stenosis.  Plan for DAPT with ASA/Plavix for at least 12 months. The patient was seen by cardiac rehab while in short stay. There were no observed complications post cath. Radial cath site was re-evaluated prior to discharge and found to be stable without any complications. Instructions/precautions regarding cath site care were given prior to discharge.  Robert Limes, Robert Owen was seen by Dr. Burt Knack  and determined stable for discharge home. Follow up with our office has been arranged. Medications are listed below. Pertinent changes include None.   _____________  Cath/PCI Registry Performance & Quality Measures: 1. Aspirin prescribed? - Yes 2. ADP Receptor Inhibitor (Plavix/Clopidogrel, Brilinta/Ticagrelor or Effient/Prasugrel) prescribed (includes medically managed patients)? - Yes 3. High Intensity Statin (Lipitor 40-80mg  or Crestor 20-40mg ) prescribed? - Yes 4. For EF <40%, was ACEI/ARB prescribed? - Not Applicable (EF >/= [id number]) 5. For EF <40%, Aldosterone Antagonist (Spironolactone or Eplerenone) prescribed? - Not Applicable (EF >/= [id number]) 6. Cardiac Rehab Phase II ordered (Included Medically managed Patients)? - Yes  Discharge Vitals Blood pressure (!) 162/77, pulse (!) 59, temperature 98.3 F (36.8 C), temperature source Oral, resp. rate 17, height 5\' 7"  (1.702 m), weight 71.7 kg, SpO2 99 %.  Filed Weights   10/29/19 1026  Weight: 71.7 kg    Last Labs & Radiologic Studies   _____________  CARDIAC CATHETERIZATION  Result Date: 10/29/2019 1.  Severe stenosis involving the ostium and proximal portions of the RCA, treated successfully with PCI using a 3.0 x 34 mm resolute Onyx DES 2.  Continued patency of the stented segment in the mid RCA 3.  Mild to moderate diffuse stenosis in the  LAD and left circumflex, unchanged from the previous cardiac cath study. 4.  Mild hypokinesis of the inferoapical LV segments, otherwise normal LV contractility with LVEF 50 to 55% Recommendations: Dual antiplatelet therapy with aspirin and clopidogrel for 12 months as tolerated.  Continued aggressive risk reduction measures.   Disposition   Pt is being discharged home today in good condition.  Follow-up Plans & Appointments    Follow-up Information    Liliane Shi, PA-C. Go on 11/13/2019.   Specialties: Cardiology, Physician Assistant Why: @11 :15am for cath follow up  Contact information: [address] [place] [address] [phone]             Discharge Medications   Allergies as of 10/29/2019      Reactions   Eugenol Other (See Comments)   Topical dental anesthetic caused migraine type headache      Medication List    TAKE these medications   acetaminophen 500 MG tablet Commonly known as: TYLENOL Take 1,000 mg by mouth every 6 (six) hours as needed for moderate pain or headache.   aspirin 81 MG tablet Take 81 mg by mouth daily.   clopidogrel 75 MG tablet Commonly known as: PLAVIX Take 1 tablet (75 mg total) by mouth daily.   ezetimibe 10 MG tablet Commonly known as: ZETIA Take 10 mg by mouth at bedtime.   metoprolol tartrate 50 MG tablet Commonly known as: LOPRESSOR Take 1 tablet (50 mg total) by mouth 2 (two) times daily.   Nitrostat 0.4 MG SL tablet Generic drug: nitroGLYCERIN PLACE 1 TABLET UNDER THE TONGUE EVERY 5 MINUTES AS NEEDED FOR CHEST PAIN. What changed:   how much to take  how to take this  when to take this  reasons to take this  additional instructions   pantoprazole 40 MG tablet Commonly known as: PROTONIX Take 1 tablet (40 mg total) by mouth daily. What changed: when to take this   rosuvastatin 40 MG tablet Commonly known as: CRESTOR Take 40 mg by mouth daily.          Allergies Allergies    Allergen Reactions  . Eugenol Other (See Comments)    Topical dental anesthetic caused migraine type headache    Outstanding Labs/Studies   None  Duration of Discharge Encounter   Greater than 30 minutes including physician time.  Jarrett Soho, PA 10/29/2019, 1:37 PM

## 2019-10-30 MED FILL — Nitroglycerin IV Soln 100 MCG/ML in D5W: INTRA_ARTERIAL | Qty: 10 | Status: AC

## 2019-11-12 ENCOUNTER — Ambulatory Visit (HOSPITAL_COMMUNITY)
Admission: RE | Admit: 2019-11-12 | Discharge: 2019-11-12 | Disposition: A | Discharge status: Home / Self Care | Payer: PPO | Source: Ambulatory Visit | Attending: Cardiology | Admitting: Cardiology

## 2019-11-12 ENCOUNTER — Other Ambulatory Visit: Payer: Self-pay

## 2019-11-12 ENCOUNTER — Other Ambulatory Visit (HOSPITAL_COMMUNITY): Payer: Self-pay | Admitting: Cardiovascular Disease

## 2019-11-12 DIAGNOSIS — R0989 Other specified symptoms and signs involving the circulatory and respiratory systems: Secondary | ICD-10-CM | POA: Diagnosis not present

## 2019-11-12 DIAGNOSIS — I6523 Occlusion and stenosis of bilateral carotid arteries: Secondary | ICD-10-CM

## 2019-11-12 NOTE — Progress Notes (Signed)
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:   Coronary artery disease  S/p NSTEMI in 5/13 ([place] in [place]) >> PCI: DES x 2 to mid and dist RCA  Cath 6/14: RCA stents patent  Cath 5/21: oRCA 90, pRCA 80, mRCA stent ok >> PCI: DES to ost and prox RCA  Hypertension   Hyperlipidemia   Cervical DDD  Prior CV studies: Cardiac catheterization 10-31-19 LM ost 25 LAD ost 30, mid 52 w/ side br in 2nd septal 50; D2 40 LCx ost 40 RCA ost 90, prox 80, mid stent patent with 30 ISR; RPDA 40 EF 50-55, mild inf-apical [place] PCI: 3 x 34 mm Resolute Onyx DES to ost and prox RCA       LHC (6/14):  Mid LAD 40-50 ostial circumflex 30-40 mid and distal RCA stents patent, proximal PDA 40 EF 55%   History of Present Illness:    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 Burt Owen 10/23/2019.  He was having symptoms of exertional angina and cardiac catheterization was arranged.  This demonstrated high grade stenosis in the ostial and proximal RCA which was treated with a single DES.  His RCA stent was patent and there was mod non-obstructive disease in the LAD and LCx.  He returns for follow up.  He is here alone.  He has not had any further angina since his PCI.  He has not had shortness of breath, syncope, leg swelling.  He has a lot of symptoms related to arthritis in his neck.  He has C6 radiculopathy symptoms in both hands.  He is having more interscapular pain this AM (L sided).       Past Medical History:  Diagnosis Date  . Allergy   . Bladder diverticulum   . CAD (coronary artery disease) cardiologist-  dr Robert Owen   NSTEMI 10/20/11: [facility] at [place]: mRCA  70% and 80% hazy, dRCA 90%, EF 65%; >>  PCI: Promus DES x 2 (3x38 mm and 3x12 mm) to mid and distal RCA;  b.  LHC (6/14): mid and distal RCA stents patent // Cath 5/21: oLM 25, oLAD 30, mLAD 11 w side br in 2nd septal 50; D2 40, oLCx 40, mRCA stent ok w 30 ISR, RPDA 40; ost and prox RCA 90, 80 >> PCI: DES to ost/prox RCA   . Cataract    removed bilaterally   . Cervical disc disease    C 6 L radiculopathy on L  . Cervical radiculopathy at C6    left side  . Gross hematuria   . Heart murmur    MVP  . History of adenomatous polyp of Owen    05-17-2015  tubular adenomas  . History of non-ST elevation myocardial infarction (NSTEMI)    10-20-2011  s/p  cardiac cath in [place]  . HTN (hypertension)   . Hx of adenomatous colonic polyps 05/23/2015  . Hyperlipidemia   . Myocardial infarction [place])    NSTEMI- 2 stents   . OA (osteoarthritis)   . Positive PPD   . S/P drug eluting coronary stent placement  10-21-2011  at [place]  DES x2 to distal and mid RCA  . Seasonal allergies   . Urgency of urination     Current Medications: Current Meds  Medication Sig  . aspirin 81 MG tablet Take 81 mg by mouth daily.  . clopidogrel (PLAVIX) 75 MG tablet Take 1 tablet (75 mg total) by mouth daily.  Marland Kitchen ezetimibe (ZETIA) 10 MG tablet Take 10 mg by mouth at bedtime.   . metoprolol tartrate (LOPRESSOR) 50 MG tablet Take 1 tablet (50 mg total) by mouth 2 (two) times daily.  Marland Kitchen NITROSTAT 0.4 MG SL tablet PLACE 1 TABLET UNDER THE TONGUE EVERY 5 MINUTES AS NEEDED FOR CHEST PAIN. (Patient taking differently: Place 0.4 mg under the tongue every 5 (five) minutes as needed for chest pain. )  . pantoprazole (PROTONIX) 40 MG tablet Take 1 tablet (40 mg total) by mouth daily.  . rosuvastatin (CRESTOR) 40 MG tablet Take 40 mg by mouth daily.     Allergies:   Eugenol   Social History   Tobacco Use  . Smoking status: Never Smoker  . Smokeless tobacco: Never Used  Substance  Use Topics  . Alcohol use: Yes    Alcohol/week: 0.0 standard drinks    Comment: OCCASIONAL  . Drug use: No     Family Hx: The patient's family history includes Alcohol abuse in his father and mother; Dementia in his paternal grandmother; Diabetes in his brother and sister; Heart attack (age of onset: 27) in his father; Rheum arthritis in his paternal grandfather. There is no history of Owen cancer, Owen polyps, Esophageal cancer, Rectal cancer, or Stomach cancer.  ROS   EKGs/Labs/Other Test Reviewed:    EKG:  EKG is   ordered today.  The ekg ordered today demonstrates sinus brady, HR 56, normal axis, j point elevation inferiorly, non-specific ST-TW changes, QTc 405  Recent Labs: 10/23/2019: BUN 18; Creatinine, Ser 0.73; Hemoglobin 15.7; Platelets 186; Potassium 5.0; Sodium 142   Recent Lipid Panel Lab Results  Component Value Date/Time   CHOL 154 06/30/2012 02:14 PM   TRIG 125.0 06/30/2012 02:14 PM   HDL 50.40 06/30/2012 02:14 PM   CHOLHDL 3 06/30/2012 02:14 PM   LDLCALC 79 06/30/2012 02:14 PM    Physical Exam:    VS:  BP 134/60   Pulse 60   Ht 5\' 7"  (1.702 m)   Wt 160 lb 12.8 oz (72.9 kg)   SpO2 97%   BMI 25.18 kg/m     Wt Readings from Last 3 Encounters:  11/13/19 160 lb 12.8 oz (72.9 kg)  10/29/19 158 lb (71.7 kg)  10/23/19 159 lb 9.6 oz (72.4 kg)     Constitutional:      Appearance: Healthy appearance. Not in distress.  Neck:     Vascular: JVD normal.  Pulmonary:     Effort: Pulmonary effort is normal.     Breath sounds: No wheezing. No rales.  Cardiovascular:     Normal rate. Regular rhythm. Normal S1. Normal S2.     Murmurs: There is no murmur.     Comments: R wrist without hematoma  Edema:    Peripheral edema absent.  Abdominal:     Palpations: Abdomen is soft. There is no hepatomegaly.  Skin:    General: Skin is warm and dry.  Neurological:     General: No focal deficit present.     Mental Status: Alert and oriented to person, place and time.  Cranial Nerves: Cranial nerves are intact.       ASSESSMENT & PLAN:    1. Coronary artery disease involving native coronary artery of native heart without angina pectoris Hx of NSTEMI in 2013 tx with DES x 2 to the RCA.  He is now s/p DES to the ost and prox RCA.  He has mild to mod residual disease in the LAD and LCx.  He is not having further angina. ECG with j point elevation in inf leads.  This is similar to old ECGs.  I also reviewed his ECG with Dr. Burt Owen today, who agreed.  Dr. Burt Owen also saw the patient and reviewed his cath films with him.  Continue ASA, Clopidogrel, Ezetimibe, Metoprolol, Rosuvastatin.  F/u in 6 mos.    2. Bilateral carotid artery stenosis Recent dopplers with R 40-59% stenosis and L 1-39% stenosis.  Continue statin Rx.  Repeat in 1 year.   3. Essential hypertension The patient's blood pressure is controlled on his current regimen.  Continue current therapy.    4. Mixed hyperlipidemia LDL optimal on most recent lab work.  Continue current Rx.      Dispo:  Return in about 6 months (around 05/14/2020) for Routine Follow Up, w/ Dr. Burt Owen, in person.   Medication Adjustments/Labs and Tests Ordered: Current medicines are reviewed at length with the patient today.  Concerns regarding medicines are outlined above.  Tests Ordered: Orders Placed This Encounter  Procedures  . EKG 12-Lead   Medication Changes: No orders of the defined types were placed in this encounter.   Signed, Richardson Dopp, PA-C  11/13/2019 [facility] [place], [place], [place]  [address] Phone: [phone]; Fax: [phone]

## 2019-11-13 ENCOUNTER — Ambulatory Visit (INDEPENDENT_AMBULATORY_CARE_PROVIDER_SITE_OTHER): Payer: PPO | Admitting: Physician Assistant

## 2019-11-13 ENCOUNTER — Encounter: Payer: Self-pay | Admitting: Physician Assistant

## 2019-11-13 VITALS — BP 134/60 | HR 60 | Ht 67.0 in | Wt 160.8 lb

## 2019-11-13 DIAGNOSIS — I6523 Occlusion and stenosis of bilateral carotid arteries: Secondary | ICD-10-CM

## 2019-11-13 DIAGNOSIS — I2510 Atherosclerotic heart disease of native coronary artery without angina pectoris: Secondary | ICD-10-CM

## 2019-11-13 DIAGNOSIS — I10 Essential (primary) hypertension: Secondary | ICD-10-CM | POA: Diagnosis not present

## 2019-11-13 DIAGNOSIS — E782 Mixed hyperlipidemia: Secondary | ICD-10-CM | POA: Diagnosis not present

## 2019-11-13 NOTE — Patient Instructions (Signed)
Medication Instructions:  Your physician recommends that you continue on your current medications as directed. Please refer to the Current Medication list given to you today.  *If you need a refill on your cardiac medications before your next appointment, please call your pharmacy*   Lab Work: None If you have labs (blood work) drawn today and your tests are completely normal, you will receive your results only by: Marland Kitchen MyChart Message (if you have MyChart) OR . A paper copy in the mail If you have any lab test that is abnormal or we need to change your treatment, we will call you to review the results.   Testing/Procedures: None   Follow-Up: At [facility], you and your health needs are our priority.  As part of our continuing mission to provide you with exceptional heart care, we have created designated Provider Care Teams.  These Care Teams include your primary Cardiologist (physician) and Advanced Practice Providers (APPs -  Physician Assistants and Nurse Practitioners) who all work together to provide you with the care you need, when you need it.  We recommend signing up for the patient portal called "MyChart".  Sign up information is provided on this After Visit Summary.  MyChart is used to connect with patients for Virtual Visits (Telemedicine).  Patients are able to view lab/test results, encounter notes, upcoming appointments, etc.  Non-urgent messages can be sent to your provider as well.   To learn more about what you can do with MyChart, go to [email or web address].    Your next appointment:   6 month(s)  The format for your next appointment:   In Person  Provider:   You may see Sherren Mocha, MD or one of the following Advanced Practice Providers on your designated Care Team:    Richardson Dopp, PA-C  [place], [place]

## 2019-11-20 ENCOUNTER — Telehealth (HOSPITAL_COMMUNITY): Payer: Self-pay

## 2019-11-20 ENCOUNTER — Encounter (HOSPITAL_COMMUNITY): Payer: Self-pay

## 2019-11-20 NOTE — Telephone Encounter (Signed)
Attempted to call patient in regards to Cardiac Rehab - LM on VM Mailed letter 

## 2019-12-07 ENCOUNTER — Telehealth (HOSPITAL_COMMUNITY): Payer: Self-pay

## 2019-12-07 NOTE — Telephone Encounter (Signed)
No response from pt regarding CR.  Closed referral.  

## 2019-12-08 DIAGNOSIS — R300 Dysuria: Secondary | ICD-10-CM | POA: Diagnosis not present

## 2019-12-08 DIAGNOSIS — R809 Proteinuria, unspecified: Secondary | ICD-10-CM | POA: Diagnosis not present

## 2019-12-08 DIAGNOSIS — R82998 Other abnormal findings in urine: Secondary | ICD-10-CM | POA: Diagnosis not present

## 2019-12-08 DIAGNOSIS — Z0000 Encounter for general adult medical examination without abnormal findings: Secondary | ICD-10-CM | POA: Diagnosis not present

## 2019-12-08 DIAGNOSIS — Z1339 Encounter for screening examination for other mental health and behavioral disorders: Secondary | ICD-10-CM | POA: Diagnosis not present

## 2019-12-08 DIAGNOSIS — Z1331 Encounter for screening for depression: Secondary | ICD-10-CM | POA: Diagnosis not present

## 2019-12-08 DIAGNOSIS — N323 Diverticulum of bladder: Secondary | ICD-10-CM | POA: Diagnosis not present

## 2019-12-08 DIAGNOSIS — Z9861 Coronary angioplasty status: Secondary | ICD-10-CM | POA: Diagnosis not present

## 2019-12-08 DIAGNOSIS — I10 Essential (primary) hypertension: Secondary | ICD-10-CM | POA: Diagnosis not present

## 2019-12-08 DIAGNOSIS — E785 Hyperlipidemia, unspecified: Secondary | ICD-10-CM | POA: Diagnosis not present

## 2019-12-08 DIAGNOSIS — Z86010 Personal history of colonic polyps: Secondary | ICD-10-CM | POA: Diagnosis not present

## 2019-12-08 DIAGNOSIS — M50123 Cervical disc disorder at C6-C7 level with radiculopathy: Secondary | ICD-10-CM | POA: Diagnosis not present

## 2019-12-08 DIAGNOSIS — R7301 Impaired fasting glucose: Secondary | ICD-10-CM | POA: Diagnosis not present

## 2019-12-08 MED FILL — MUPIROCIN 2% OINTMENT: 2 | 22 days supply | Qty: 44 | Fill #0

## 2020-01-06 MED FILL — ROSUVASTATIN CALCIUM 40 MG: 40 | 90 days supply | Qty: 90 | Fill #0

## 2020-01-11 ENCOUNTER — Other Ambulatory Visit (HOSPITAL_COMMUNITY): Payer: Self-pay | Admitting: Internal Medicine

## 2020-01-12 ENCOUNTER — Other Ambulatory Visit (HOSPITAL_COMMUNITY): Payer: Self-pay | Admitting: Internal Medicine

## 2020-01-12 MED FILL — CLOPIDOGREL 75 MG TABLET: 75 | 90 days supply | Qty: 90 | Fill #1

## 2020-01-12 MED FILL — EZETIMIBE 10 MG TABS: 10 | 90 days supply | Qty: 90 | Fill #0

## 2020-01-20 MED FILL — METOPROLOL TARTRATE 50 MG T: 50 | 90 days supply | Qty: 180 | Fill #1

## 2020-01-30 DIAGNOSIS — Z1152 Encounter for screening for COVID-19: Secondary | ICD-10-CM | POA: Diagnosis not present

## 2020-04-05 MED FILL — ROSUVASTATIN CALCIUM 40 MG: 40 | 90 days supply | Qty: 90 | Fill #0

## 2020-04-13 MED FILL — CLOPIDOGREL 75 MG TABLET: 75 | 90 days supply | Qty: 90 | Fill #2

## 2020-04-13 MED FILL — EZETIMIBE 10 MG TABS: 10 | 90 days supply | Qty: 90 | Fill #1

## 2020-05-02 MED FILL — METOPROLOL TARTRATE 50 MG T: 50 | 90 days supply | Qty: 180 | Fill #2

## 2020-05-16 DIAGNOSIS — R310 Gross hematuria: Secondary | ICD-10-CM | POA: Diagnosis not present

## 2020-05-16 DIAGNOSIS — N5082 Scrotal pain: Secondary | ICD-10-CM | POA: Diagnosis not present

## 2020-05-16 DIAGNOSIS — Z125 Encounter for screening for malignant neoplasm of prostate: Secondary | ICD-10-CM | POA: Diagnosis not present

## 2020-05-16 DIAGNOSIS — N323 Diverticulum of bladder: Secondary | ICD-10-CM | POA: Diagnosis not present

## 2020-07-04 MED FILL — ROSUVASTATIN CALCIUM 40 MG: 40 | 90 days supply | Qty: 90 | Fill #1

## 2020-07-18 MED FILL — CLOPIDOGREL 75 MG TABLET: 75 | 90 days supply | Qty: 90 | Fill #3

## 2020-07-18 MED FILL — EZETIMIBE 10 MG TABS: 10 | 90 days supply | Qty: 90 | Fill #2

## 2020-07-28 MED FILL — METOPROLOL TARTRATE 50 MG T: 50 | 90 days supply | Qty: 180 | Fill #3

## 2020-10-03 ENCOUNTER — Other Ambulatory Visit (HOSPITAL_COMMUNITY): Payer: Self-pay

## 2020-10-03 MED FILL — Rosuvastatin Calcium Tab 40 MG: ORAL | 90 days supply | Qty: 90 | Fill #0 | Status: AC

## 2020-10-14 ENCOUNTER — Other Ambulatory Visit: Payer: Self-pay | Admitting: Cardiovascular Disease

## 2020-10-14 ENCOUNTER — Other Ambulatory Visit (HOSPITAL_COMMUNITY): Payer: Self-pay

## 2020-10-14 MED ORDER — CLOPIDOGREL BISULFATE 75 MG PO TABS
75.0000 mg | ORAL_TABLET | Freq: Every day | ORAL | 3 refills | Status: DC
  Filled 2020-10-14: qty 90, 90d supply, fill #0
  Filled 2021-01-09: qty 90, 90d supply, fill #1
  Filled 2021-04-09: qty 90, 90d supply, fill #2
  Filled 2021-07-10: qty 90, 90d supply, fill #3

## 2020-10-19 ENCOUNTER — Other Ambulatory Visit (HOSPITAL_COMMUNITY): Payer: Self-pay

## 2020-10-19 MED FILL — Ezetimibe Tab 10 MG: ORAL | 90 days supply | Qty: 90 | Fill #0 | Status: AC

## 2020-10-20 ENCOUNTER — Other Ambulatory Visit (HOSPITAL_COMMUNITY): Payer: Self-pay

## 2020-10-26 ENCOUNTER — Other Ambulatory Visit (HOSPITAL_COMMUNITY): Payer: Self-pay

## 2020-10-26 ENCOUNTER — Other Ambulatory Visit: Payer: Self-pay | Admitting: Cardiovascular Disease

## 2020-10-26 DIAGNOSIS — I25118 Atherosclerotic heart disease of native coronary artery with other forms of angina pectoris: Secondary | ICD-10-CM

## 2020-10-26 MED ORDER — METOPROLOL TARTRATE 50 MG PO TABS
50.0000 mg | ORAL_TABLET | Freq: Two times a day (BID) | ORAL | 0 refills | Status: DC
  Filled 2020-10-26: qty 60, 30d supply, fill #0

## 2020-10-27 ENCOUNTER — Other Ambulatory Visit (HOSPITAL_COMMUNITY): Payer: Self-pay

## 2020-11-10 ENCOUNTER — Other Ambulatory Visit (HOSPITAL_COMMUNITY): Payer: Self-pay

## 2020-11-10 MED ORDER — PREDNISONE 20 MG PO TABS
ORAL_TABLET | ORAL | 0 refills | Status: DC
  Filled 2020-11-10: qty 15, 5d supply, fill #0

## 2020-11-11 ENCOUNTER — Ambulatory Visit (HOSPITAL_COMMUNITY)
Admission: RE | Admit: 2020-11-11 | Discharge: 2020-11-11 | Disposition: A | Discharge status: Home / Self Care | Payer: PPO | Source: Ambulatory Visit | Attending: Cardiovascular Disease | Admitting: Cardiovascular Disease

## 2020-11-11 ENCOUNTER — Other Ambulatory Visit (HOSPITAL_COMMUNITY): Payer: Self-pay | Admitting: Cardiovascular Disease

## 2020-11-11 ENCOUNTER — Other Ambulatory Visit: Payer: Self-pay

## 2020-11-11 DIAGNOSIS — I6523 Occlusion and stenosis of bilateral carotid arteries: Secondary | ICD-10-CM | POA: Diagnosis not present

## 2020-11-16 ENCOUNTER — Other Ambulatory Visit (HOSPITAL_COMMUNITY): Payer: Self-pay

## 2020-11-16 MED ORDER — NIRMATRELVIR/RITONAVIR (PAXLOVID) TABLET (RENAL DOSING)
1.0000 | ORAL_TABLET | Freq: Two times a day (BID) | ORAL | 0 refills | Status: DC
  Filled 2020-11-16: qty 20, 10d supply, fill #0

## 2020-11-16 MED ORDER — ALBUTEROL SULFATE HFA 108 (90 BASE) MCG/ACT IN AERS
2.0000 | INHALATION_SPRAY | RESPIRATORY_TRACT | 1 refills | Status: DC
  Filled 2020-11-16: qty 18, 17d supply, fill #0

## 2020-11-16 MED ORDER — PAXLOVID 20 X 150 MG & 10 X 100MG PO TBPK
3.0000 | ORAL_TABLET | Freq: Two times a day (BID) | ORAL | 0 refills | Status: DC
  Filled 2020-11-16: qty 30, 5d supply, fill #0

## 2020-11-30 ENCOUNTER — Other Ambulatory Visit: Payer: Self-pay | Admitting: Cardiovascular Disease

## 2020-11-30 ENCOUNTER — Other Ambulatory Visit (HOSPITAL_COMMUNITY): Payer: Self-pay

## 2020-11-30 DIAGNOSIS — I25118 Atherosclerotic heart disease of native coronary artery with other forms of angina pectoris: Secondary | ICD-10-CM

## 2020-11-30 MED ORDER — METOPROLOL TARTRATE 50 MG PO TABS
50.0000 mg | ORAL_TABLET | Freq: Two times a day (BID) | ORAL | 0 refills | Status: DC
  Filled 2020-11-30: qty 30, 15d supply, fill #0

## 2020-12-05 ENCOUNTER — Other Ambulatory Visit (HOSPITAL_COMMUNITY): Payer: Self-pay

## 2020-12-05 ENCOUNTER — Other Ambulatory Visit: Payer: Self-pay

## 2020-12-05 ENCOUNTER — Telehealth: Payer: Self-pay | Admitting: Cardiovascular Disease

## 2020-12-05 DIAGNOSIS — I25118 Atherosclerotic heart disease of native coronary artery with other forms of angina pectoris: Secondary | ICD-10-CM

## 2020-12-05 MED ORDER — METOPROLOL TARTRATE 50 MG PO TABS
50.0000 mg | ORAL_TABLET | Freq: Two times a day (BID) | ORAL | 1 refills | Status: DC
  Filled 2020-12-05: qty 60, 30d supply, fill #0
  Filled 2021-01-17: qty 60, 30d supply, fill #1

## 2020-12-05 NOTE — Telephone Encounter (Signed)
Spoke with pharmacy who states the patient has picked up his medication a few days ago.   Left message for patient to contact office if he has any questions about his medication.

## 2020-12-05 NOTE — Telephone Encounter (Signed)
*  STAT* If patient is at the pharmacy, call can be transferred to refill team.   1. Which medications need to be refilled? (please list name of each medication and dose if known) metoprolol tartrate (LOPRESSOR) 50 MG tablet  2. Which pharmacy/location (including street and city if local pharmacy) is medication to be sent to? [facility]  3. Do they need a 30 day or 90 day supply? 90 day   Patient has an appointment 03/20/2021

## 2020-12-06 ENCOUNTER — Other Ambulatory Visit (HOSPITAL_COMMUNITY): Payer: Self-pay

## 2020-12-22 ENCOUNTER — Other Ambulatory Visit (HOSPITAL_COMMUNITY): Payer: Self-pay

## 2020-12-22 DIAGNOSIS — Z1331 Encounter for screening for depression: Secondary | ICD-10-CM | POA: Diagnosis not present

## 2020-12-22 DIAGNOSIS — Z86010 Personal history of colonic polyps: Secondary | ICD-10-CM | POA: Diagnosis not present

## 2020-12-22 DIAGNOSIS — N323 Diverticulum of bladder: Secondary | ICD-10-CM | POA: Diagnosis not present

## 2020-12-22 DIAGNOSIS — Z1339 Encounter for screening examination for other mental health and behavioral disorders: Secondary | ICD-10-CM | POA: Diagnosis not present

## 2020-12-22 DIAGNOSIS — M79674 Pain in right toe(s): Secondary | ICD-10-CM | POA: Diagnosis not present

## 2020-12-22 DIAGNOSIS — R7301 Impaired fasting glucose: Secondary | ICD-10-CM | POA: Diagnosis not present

## 2020-12-22 DIAGNOSIS — Z0000 Encounter for general adult medical examination without abnormal findings: Secondary | ICD-10-CM | POA: Diagnosis not present

## 2020-12-22 DIAGNOSIS — I10 Essential (primary) hypertension: Secondary | ICD-10-CM | POA: Diagnosis not present

## 2020-12-22 DIAGNOSIS — M546 Pain in thoracic spine: Secondary | ICD-10-CM | POA: Diagnosis not present

## 2020-12-22 DIAGNOSIS — E785 Hyperlipidemia, unspecified: Secondary | ICD-10-CM | POA: Diagnosis not present

## 2020-12-22 DIAGNOSIS — Z7689 Persons encountering health services in other specified circumstances: Secondary | ICD-10-CM | POA: Diagnosis not present

## 2020-12-22 DIAGNOSIS — I2510 Atherosclerotic heart disease of native coronary artery without angina pectoris: Secondary | ICD-10-CM | POA: Diagnosis not present

## 2020-12-22 DIAGNOSIS — M50123 Cervical disc disorder at C6-C7 level with radiculopathy: Secondary | ICD-10-CM | POA: Diagnosis not present

## 2020-12-22 MED ORDER — NITROGLYCERIN 0.4 MG SL SUBL
0.4000 mg | SUBLINGUAL_TABLET | SUBLINGUAL | 3 refills | Status: DC | PRN
  Filled 2020-12-22: qty 25, 5d supply, fill #0

## 2021-01-03 ENCOUNTER — Other Ambulatory Visit (HOSPITAL_COMMUNITY): Payer: Self-pay

## 2021-01-03 MED ORDER — LOSARTAN POTASSIUM 50 MG PO TABS
50.0000 mg | ORAL_TABLET | Freq: Every day | ORAL | 11 refills | Status: DC
  Filled 2021-01-03: qty 30, 30d supply, fill #0
  Filled 2021-02-06: qty 30, 30d supply, fill #1
  Filled 2021-03-09: qty 30, 30d supply, fill #2
  Filled 2021-04-09: qty 30, 30d supply, fill #3
  Filled 2021-05-05: qty 30, 30d supply, fill #4
  Filled 2021-06-08: qty 30, 30d supply, fill #5
  Filled 2021-07-10: qty 30, 30d supply, fill #6
  Filled 2021-08-15: qty 30, 30d supply, fill #7
  Filled 2021-09-18: qty 30, 30d supply, fill #8
  Filled 2021-10-24: qty 30, 30d supply, fill #9
  Filled 2021-11-23: qty 30, 30d supply, fill #10

## 2021-01-09 ENCOUNTER — Other Ambulatory Visit (HOSPITAL_COMMUNITY): Payer: Self-pay

## 2021-01-10 ENCOUNTER — Other Ambulatory Visit (HOSPITAL_COMMUNITY): Payer: Self-pay

## 2021-01-10 MED ORDER — ROSUVASTATIN CALCIUM 40 MG PO TABS
40.0000 mg | ORAL_TABLET | Freq: Every day | ORAL | 2 refills | Status: DC
  Filled 2021-01-10: qty 90, 90d supply, fill #0
  Filled 2021-04-09: qty 90, 90d supply, fill #1
  Filled 2021-07-10: qty 90, 90d supply, fill #2

## 2021-01-17 ENCOUNTER — Other Ambulatory Visit (HOSPITAL_COMMUNITY): Payer: Self-pay

## 2021-01-18 ENCOUNTER — Other Ambulatory Visit (HOSPITAL_COMMUNITY): Payer: Self-pay

## 2021-01-20 ENCOUNTER — Other Ambulatory Visit (HOSPITAL_COMMUNITY): Payer: Self-pay

## 2021-01-20 MED ORDER — EZETIMIBE 10 MG PO TABS
10.0000 mg | ORAL_TABLET | Freq: Every day | ORAL | 3 refills | Status: DC
  Filled 2021-01-20: qty 90, 90d supply, fill #0

## 2021-01-23 ENCOUNTER — Other Ambulatory Visit (HOSPITAL_COMMUNITY): Payer: Self-pay

## 2021-01-23 MED ORDER — AMOXICILLIN 500 MG PO CAPS
500.0000 mg | ORAL_CAPSULE | Freq: Three times a day (TID) | ORAL | 0 refills | Status: DC
Start: 2021-01-23 — End: 2021-03-21
  Filled 2021-01-23: qty 21, 7d supply, fill #0

## 2021-02-03 DIAGNOSIS — E785 Hyperlipidemia, unspecified: Secondary | ICD-10-CM | POA: Diagnosis not present

## 2021-02-06 ENCOUNTER — Other Ambulatory Visit (HOSPITAL_COMMUNITY): Payer: Self-pay

## 2021-02-15 ENCOUNTER — Other Ambulatory Visit: Payer: Self-pay | Admitting: Physician Assistant

## 2021-02-15 ENCOUNTER — Other Ambulatory Visit (HOSPITAL_COMMUNITY): Payer: Self-pay

## 2021-02-15 DIAGNOSIS — I25118 Atherosclerotic heart disease of native coronary artery with other forms of angina pectoris: Secondary | ICD-10-CM

## 2021-02-15 MED ORDER — METOPROLOL TARTRATE 50 MG PO TABS
50.0000 mg | ORAL_TABLET | Freq: Two times a day (BID) | ORAL | 3 refills | Status: DC
  Filled 2021-02-15: qty 60, 30d supply, fill #0

## 2021-03-09 ENCOUNTER — Other Ambulatory Visit (HOSPITAL_COMMUNITY): Payer: Self-pay

## 2021-03-20 ENCOUNTER — Ambulatory Visit: Payer: PPO | Admitting: Physician Assistant

## 2021-03-20 NOTE — Progress Notes (Addendum)
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 with:  Coronary artery disease S/p NSTEMI in 5/13 [facility] in [place], [place]) >> PCI: DES x 2 to mid and dist RCA Cath 6/14: RCA stents patent [place] >> Cath 5/21: oRCA 90, pRCA 80, mRCA stent ok >> PCI: DES to ost and prox RCA Hypertension  Hyperlipidemia  Cervical DDD Carotid stenosis [place] 6/22: Bilat ICA 1-39    Prior CV studies: Carotid US 11/11/20 Bilateral ICA 1-39  Cardiac catheterization 10/29/19 LM ost 25 LAD ost 30, mid 68 w/ side br in 2nd septal 50; D2 40 LCx ost 40 RCA ost 90, prox 80, mid stent patent with 30 ISR; RPDA 40 EF 50-55, mild inf-apical HK PCI: 3 x 34 mm Resolute Onyx DES to ost and prox RCA      LHC (6/14):   Mid LAD 40-50 ostial circumflex 30-40 mid and distal RCA stents patent, proximal PDA 40 EF 55%   History of Present Illness: Robert Owen was last sene in 6/22.  He returns for f/u.  He is here alone.  He is doing well without chest pain, shortness of breath, syncope, leg edema.  He just got back from [place].  He was able to do some hiking while there without anginal symptoms.          Past Medical History:  Diagnosis Date   Allergy    Bladder diverticulum    CAD (coronary artery disease) cardiologist-  dr cooper   NSTEMI 10/20/11: [facility] at [facility] in [place], [place]: mRCA 70% and 80% hazy, dRCA 90%, EF 65%; >>  PCI: Promus DES x 2 (3x38 mm and 3x12 mm) to mid and distal RCA;  b.  LHC (6/14): mid and distal RCA stents patent // Cath 5/21: oLM 25, oLAD 66, mLAD 45 w side br in 2nd septal 50; D2 40, oLCx 48, mRCA stent ok w 76 ISR, RPDA 40; ost and prox RCA 90,  80 >> PCI: DES to ost/prox RCA    Cataract    removed bilaterally    Cervical disc disease    C 6 L radiculopathy on L   Cervical radiculopathy at C6    left side   Gross hematuria    Heart murmur    MVP   History of adenomatous polyp of colon    05-17-2015  tubular adenomas   History of non-ST elevation myocardial infarction (NSTEMI)    10-20-2011  s/p  cardiac cath in [place], [place]   HTN (hypertension)    Hx of adenomatous colonic polyps 05/23/2015   Hyperlipidemia    Myocardial infarction [facility])    NSTEMI- 2 stents    OA (osteoarthritis)    Positive PPD    S/P drug eluting coronary stent placement    10-21-2011  at [facility] in [place], [place]  DES x2 to distal and mid RCA   Seasonal allergies    Urgency of urination    Current Medications: Current Meds  Medication Sig   aspirin 81 MG tablet Take 81 mg by mouth daily.  clopidogrel (PLAVIX) 75 MG tablet Take 1 tablet (75 mg total) by mouth daily.   ezetimibe (ZETIA) 10 MG tablet Take 10 mg by mouth at bedtime.    losartan (COZAAR) 50 MG tablet Take 1 tablet (50 mg total) by mouth daily.   nitroGLYCERIN (NITROSTAT) 0.4 MG SL tablet Place 1 tablet (0.4 mg total) under the tongue every 5 (five) minutes as needed for chest pain   rosuvastatin (CRESTOR) 40 MG tablet Take 1 tablet (40 mg total) by mouth daily.   [DISCONTINUED] metoprolol tartrate (LOPRESSOR) 50 MG tablet Take 1 tablet (50 mg total) by mouth 2 (two) times daily.    Allergies:   Eugenol   Social History   Tobacco Use   Smoking status: Never   Smokeless tobacco: Never  Vaping Use   Vaping Use: Never used  Substance Use Topics   Alcohol use: Yes    Alcohol/week: 0.0 standard drinks    Comment: OCCASIONAL   Drug use: No    Family Hx: The patient's family history includes Alcohol abuse in his father and mother; Dementia in his paternal grandmother; Diabetes in his brother and sister; Heart attack (age of onset: 67) in his father; Rheum arthritis  in his paternal grandfather. There is no history of Colon cancer, Colon polyps, Esophageal cancer, Rectal cancer, or Stomach cancer.  Review of Systems  Hematologic/Lymphatic: Does not bruise/bleed easily.    EKGs/Labs/Other Test Reviewed:    EKG:  EKG is   ordered today.  The ekg ordered today demonstrates NSR, HR 62, PR 226, normal axis, J point ?, QTc 418 ms, no change from prior tracing  Recent Labs: No results found for requested labs within last 8760 hours.   Recent Lipid Panel Lab Results  Component Value Date/Time   CHOL 154 06/30/2012 02:14 PM   TRIG 125.0 06/30/2012 02:14 PM   HDL 50.40 06/30/2012 02:14 PM   LDLCALC 79 06/30/2012 02:14 PM    Risk Assessment/Calculations:          Physical Exam:    VS:  BP (!) 140/58   Pulse 62   Ht 5\' 7"  (1.702 m)   Wt 162 lb 6.4 oz (73.7 kg)   SpO2 98%   BMI 25.44 kg/m     Wt Readings from Last 3 Encounters:  03/21/21 162 lb 6.4 oz (73.7 kg)  11/13/19 160 lb 12.8 oz (72.9 kg)  10/29/19 158 lb (71.7 kg)    Constitutional:      Appearance: Healthy appearance. Not in distress.  Neck:     Vascular: No carotid bruit. JVD normal.  Pulmonary:     Effort: Pulmonary effort is normal.     Breath sounds: No wheezing. No rales.  Cardiovascular:     Normal rate. Regular rhythm. Normal S1. Normal S2.      Murmurs: There is no murmur.  Edema:    Peripheral edema absent.  Abdominal:     Palpations: Abdomen is soft. There is no hepatomegaly.  Skin:    General: Skin is warm and dry.  Neurological:     General: No focal deficit present.     Mental Status: Alert and oriented to person, place and time.     Cranial Nerves: Cranial nerves are intact.         ASSESSMENT & PLAN:   1. Coronary artery disease involving native coronary artery of native heart with other form of angina pectoris ([place]) Hx of NSTEMI in 2013 tx with DES x 2 to the RCA  and subsequent DES to the ost and prox RCA in 5/21.  He is doing well without angina.   Given multiple stens in the RCA, I recommend remaining on DAPT for now if he can tolerate it.  He is not having any significant bleeding issues.  Continue aspirin 81 mg daily, clopidogrel 75 mg daily, ezetimibe 10 mg daily, rosuvastatin 40 mg daily, metoprolol tartrate 50 mg twice daily.  Follow-up in 1 year.  2. Carotid stenosis, asymptomatic, bilateral Minimal plaque on recent carotid ultrasound.  He has a follow-up scheduled for 1 year from now.  3. Essential hypertension Blood pressure somewhat borderline.  Repeat by me was 136/80.  I have asked him to monitor his blood pressure and let us know if he continues to run high.  Continue losartan 50 mg daily, metoprolol tartrate 50 mg twice daily.  4. Mixed hyperlipidemia Continue rosuvastatin 40 mg daily, ezetimibe 10 mg daily.  I will request most recent labs from primary care.   Addendum: Labs from 09/14/20 received from PCP and personally reviewed: Creatinine 0.8, K+ 4.9, ALT 35, LDL 55, triglycerides 75, Hgb 15.5  Dispo:  Return in about 1 year (around 03/21/2022) for Routine follow up in 1 year with Dr.Cooper or Richardson Dopp, PA-C..   Medication Adjustments/Labs and Tests Ordered: Current medicines are reviewed at length with the patient today.  Concerns regarding medicines are outlined above.  Tests Ordered: Orders Placed This Encounter  Procedures   EKG 12-Lead   Medication Changes: Meds ordered this encounter  Medications   metoprolol tartrate (LOPRESSOR) 50 MG tablet    Sig: Take 1 tablet (50 mg total) by mouth 2 (two) times daily.    Dispense:  180 tablet    Refill:  3   Signed, Richardson Dopp, PA-C  03/21/2021 9:28 AM    [facility] [place], [place], [place]  [address] Phone: [phone]; Fax: [phone]

## 2021-03-21 ENCOUNTER — Telehealth: Payer: Self-pay | Admitting: *Deleted

## 2021-03-21 ENCOUNTER — Other Ambulatory Visit: Payer: Self-pay

## 2021-03-21 ENCOUNTER — Other Ambulatory Visit (HOSPITAL_COMMUNITY): Payer: Self-pay

## 2021-03-21 ENCOUNTER — Encounter: Payer: Self-pay | Admitting: Physician Assistant

## 2021-03-21 ENCOUNTER — Ambulatory Visit: Payer: PPO | Admitting: Physician Assistant

## 2021-03-21 VITALS — BP 140/58 | HR 62 | Ht 67.0 in | Wt 162.4 lb

## 2021-03-21 DIAGNOSIS — I25118 Atherosclerotic heart disease of native coronary artery with other forms of angina pectoris: Secondary | ICD-10-CM

## 2021-03-21 DIAGNOSIS — I6523 Occlusion and stenosis of bilateral carotid arteries: Secondary | ICD-10-CM | POA: Diagnosis not present

## 2021-03-21 DIAGNOSIS — E782 Mixed hyperlipidemia: Secondary | ICD-10-CM

## 2021-03-21 DIAGNOSIS — I10 Essential (primary) hypertension: Secondary | ICD-10-CM

## 2021-03-21 MED ORDER — METOPROLOL TARTRATE 50 MG PO TABS
50.0000 mg | ORAL_TABLET | Freq: Two times a day (BID) | ORAL | 3 refills | Status: DC
  Filled 2021-03-21: qty 180, 90d supply, fill #0
  Filled 2021-06-19: qty 180, 90d supply, fill #1
  Filled 2021-09-18: qty 180, 90d supply, fill #2
  Filled 2021-12-19: qty 180, 90d supply, fill #3

## 2021-03-21 NOTE — Patient Instructions (Signed)
Medication Instructions:   Your physician recommends that you continue on your current medications as directed. Please refer to the Current Medication list given to you today.  *If you need a refill on your cardiac medications before your next appointment, please call your pharmacy*  Lab Work:  -NONE-  If you have labs (blood work) drawn today and your tests are completely normal, you will receive your results only by: [place] (if you have MyChart) OR A paper copy in the mail If you have any lab test that is abnormal or we need to change your treatment, we will call you to review the results.   Testing/Procedures:  -NONE-  Follow-Up: At [facility], you and your health needs are our priority.  As part of our continuing mission to provide you with exceptional heart care, we have created designated Provider Care Teams.  These Care Teams include your primary Cardiologist (physician) and Advanced Practice Providers (APPs -  Physician Assistants and Nurse Practitioners) who all work together to provide you with the care you need, when you need it.  We recommend signing up for the patient portal called "MyChart".  Sign up information is provided on this After Visit Summary.  MyChart is used to connect with patients for Virtual Visits (Telemedicine).  Patients are able to view lab/test results, encounter notes, upcoming appointments, etc.  Non-urgent messages can be sent to your provider as well.   To learn more about what you can do with MyChart, go to [email or web address].    Your next appointment:   1 year(s)  The format for your next appointment:   In Person  Provider:   You may see Sherren Mocha, MD or one of the following Advanced Practice Providers on your designated Care Team:   Richardson Dopp, PA-C  Other Instructions  Your physician wants you to follow-up in: 1 year with Dr. Burt Knack or Richardson Dopp, PA-C. You will receive a reminder letter in the mail two months  in advance. If you don't receive a letter, please call our office to schedule the follow-up appointment.  Please keep an eye on blood pressure and send mychart message if it is running high.

## 2021-03-21 NOTE — Telephone Encounter (Signed)
Received faxed results from Silva Bandy at Dr. Manya Silvas office.  Fax number is [phone].

## 2021-03-21 NOTE — Telephone Encounter (Signed)
Lvm with Med Rec at Dr. Raul Del office [phone] o get pt's recent labs and Doctor day labs faxed to office.

## 2021-03-28 ENCOUNTER — Other Ambulatory Visit (HOSPITAL_BASED_OUTPATIENT_CLINIC_OR_DEPARTMENT_OTHER): Payer: Self-pay | Admitting: Cardiovascular Disease

## 2021-03-28 DIAGNOSIS — I6523 Occlusion and stenosis of bilateral carotid arteries: Secondary | ICD-10-CM

## 2021-04-10 ENCOUNTER — Other Ambulatory Visit (HOSPITAL_COMMUNITY): Payer: Self-pay

## 2021-05-01 ENCOUNTER — Other Ambulatory Visit (HOSPITAL_COMMUNITY): Payer: Self-pay

## 2021-05-01 MED ORDER — EZETIMIBE 10 MG PO TABS
10.0000 mg | ORAL_TABLET | Freq: Every day | ORAL | 3 refills | Status: DC
  Filled 2021-05-01: qty 90, 90d supply, fill #0
  Filled 2021-08-09: qty 90, 90d supply, fill #1
  Filled 2021-11-14: qty 90, 90d supply, fill #2
  Filled 2022-02-13: qty 90, 90d supply, fill #3

## 2021-05-05 ENCOUNTER — Other Ambulatory Visit (HOSPITAL_COMMUNITY): Payer: Self-pay

## 2021-05-08 ENCOUNTER — Other Ambulatory Visit (HOSPITAL_COMMUNITY): Payer: Self-pay

## 2021-05-08 MED ORDER — AMOXICILLIN 500 MG PO CAPS
500.0000 mg | ORAL_CAPSULE | Freq: Three times a day (TID) | ORAL | 0 refills | Status: DC
  Filled 2021-05-08: qty 21, 7d supply, fill #0

## 2021-05-15 ENCOUNTER — Other Ambulatory Visit (HOSPITAL_COMMUNITY): Payer: Self-pay

## 2021-05-15 DIAGNOSIS — J209 Acute bronchitis, unspecified: Secondary | ICD-10-CM | POA: Diagnosis not present

## 2021-05-15 MED ORDER — BENZONATATE 100 MG PO CAPS
100.0000 mg | ORAL_CAPSULE | Freq: Three times a day (TID) | ORAL | 1 refills | Status: DC | PRN
  Filled 2021-05-15: qty 30, 10d supply, fill #0

## 2021-05-15 MED ORDER — PREDNISONE 20 MG PO TABS
40.0000 mg | ORAL_TABLET | Freq: Every day | ORAL | 1 refills | Status: DC
Start: 2021-05-15 — End: 2021-11-28
  Filled 2021-05-15: qty 12, 6d supply, fill #0
  Filled 2021-05-25: qty 12, 6d supply, fill #1

## 2021-05-15 MED ORDER — FLUTICASONE FUROATE-VILANTEROL 100-25 MCG/ACT IN AEPB
1.0000 | INHALATION_SPRAY | Freq: Every day | RESPIRATORY_TRACT | 1 refills | Status: DC
Start: 2021-05-15 — End: 2021-11-28
  Filled 2021-05-15: qty 60, 60d supply, fill #0

## 2021-05-15 MED ORDER — ALBUTEROL SULFATE HFA 108 (90 BASE) MCG/ACT IN AERS
2.0000 | INHALATION_SPRAY | RESPIRATORY_TRACT | 1 refills | Status: DC | PRN
  Filled 2021-05-15: qty 18, 17d supply, fill #0

## 2021-05-25 ENCOUNTER — Other Ambulatory Visit (HOSPITAL_COMMUNITY): Payer: Self-pay

## 2021-06-09 ENCOUNTER — Other Ambulatory Visit (HOSPITAL_COMMUNITY): Payer: Self-pay

## 2021-06-13 DIAGNOSIS — H52222 Regular astigmatism, left eye: Secondary | ICD-10-CM | POA: Diagnosis not present

## 2021-06-13 DIAGNOSIS — Z961 Presence of intraocular lens: Secondary | ICD-10-CM | POA: Diagnosis not present

## 2021-06-13 DIAGNOSIS — H04123 Dry eye syndrome of bilateral lacrimal glands: Secondary | ICD-10-CM | POA: Diagnosis not present

## 2021-06-13 DIAGNOSIS — H26492 Other secondary cataract, left eye: Secondary | ICD-10-CM | POA: Diagnosis not present

## 2021-06-13 DIAGNOSIS — H5212 Myopia, left eye: Secondary | ICD-10-CM | POA: Diagnosis not present

## 2021-06-13 DIAGNOSIS — H524 Presbyopia: Secondary | ICD-10-CM | POA: Diagnosis not present

## 2021-06-13 DIAGNOSIS — H5702 Anisocoria: Secondary | ICD-10-CM | POA: Diagnosis not present

## 2021-06-19 ENCOUNTER — Other Ambulatory Visit (HOSPITAL_COMMUNITY): Payer: Self-pay

## 2021-07-10 ENCOUNTER — Other Ambulatory Visit (HOSPITAL_COMMUNITY): Payer: Self-pay

## 2021-08-10 ENCOUNTER — Other Ambulatory Visit (HOSPITAL_COMMUNITY): Payer: Self-pay

## 2021-08-15 ENCOUNTER — Other Ambulatory Visit (HOSPITAL_COMMUNITY): Payer: Self-pay

## 2021-09-18 ENCOUNTER — Other Ambulatory Visit (HOSPITAL_COMMUNITY): Payer: Self-pay

## 2021-10-06 ENCOUNTER — Other Ambulatory Visit (HOSPITAL_COMMUNITY): Payer: Self-pay

## 2021-10-06 ENCOUNTER — Other Ambulatory Visit: Payer: Self-pay | Admitting: Cardiovascular Disease

## 2021-10-06 MED ORDER — ROSUVASTATIN CALCIUM 40 MG PO TABS
40.0000 mg | ORAL_TABLET | Freq: Every day | ORAL | 3 refills | Status: DC
  Filled 2021-10-06: qty 90, 90d supply, fill #0
  Filled 2021-12-26: qty 90, 90d supply, fill #1
  Filled 2022-04-04: qty 90, 90d supply, fill #2
  Filled 2022-06-18: qty 90, 90d supply, fill #3

## 2021-10-06 MED ORDER — CLOPIDOGREL BISULFATE 75 MG PO TABS
75.0000 mg | ORAL_TABLET | Freq: Every day | ORAL | 1 refills | Status: DC
  Filled 2021-10-06: qty 90, 90d supply, fill #0
  Filled 2021-12-26: qty 90, 90d supply, fill #1

## 2021-10-09 ENCOUNTER — Other Ambulatory Visit (HOSPITAL_COMMUNITY): Payer: Self-pay

## 2021-10-24 ENCOUNTER — Other Ambulatory Visit (HOSPITAL_COMMUNITY): Payer: Self-pay

## 2021-11-14 ENCOUNTER — Other Ambulatory Visit (HOSPITAL_COMMUNITY): Payer: Self-pay

## 2021-11-15 ENCOUNTER — Ambulatory Visit (HOSPITAL_COMMUNITY)
Admission: RE | Admit: 2021-11-15 | Discharge: 2021-11-15 | Disposition: A | Discharge status: Home / Self Care | Payer: PPO | Source: Ambulatory Visit | Attending: Cardiovascular Disease | Admitting: Cardiovascular Disease

## 2021-11-15 DIAGNOSIS — I6523 Occlusion and stenosis of bilateral carotid arteries: Secondary | ICD-10-CM | POA: Diagnosis not present

## 2021-11-23 ENCOUNTER — Other Ambulatory Visit (HOSPITAL_COMMUNITY): Payer: Self-pay

## 2021-11-27 ENCOUNTER — Other Ambulatory Visit (HOSPITAL_COMMUNITY): Payer: Self-pay

## 2021-11-27 MED ORDER — LOSARTAN POTASSIUM 50 MG PO TABS
50.0000 mg | ORAL_TABLET | Freq: Every day | ORAL | 3 refills | Status: DC
  Filled 2021-11-27: qty 90, 90d supply, fill #0
  Filled 2022-02-13: qty 90, 90d supply, fill #1
  Filled 2022-05-23: qty 90, 90d supply, fill #2
  Filled 2022-07-19 – 2022-08-20 (×3): qty 90, 90d supply, fill #3

## 2021-11-28 ENCOUNTER — Ambulatory Visit: Payer: PPO | Admitting: Physician Assistant

## 2021-11-28 ENCOUNTER — Encounter: Payer: Self-pay | Admitting: Physician Assistant

## 2021-11-28 ENCOUNTER — Ambulatory Visit
Admission: RE | Admit: 2021-11-28 | Discharge: 2021-11-28 | Disposition: A | Discharge status: Home / Self Care | Payer: PPO | Source: Ambulatory Visit | Attending: Physician Assistant | Admitting: Physician Assistant

## 2021-11-28 VITALS — BP 140/66 | HR 61 | Ht 67.0 in | Wt 162.8 lb

## 2021-11-28 DIAGNOSIS — R072 Precordial pain: Secondary | ICD-10-CM | POA: Diagnosis not present

## 2021-11-28 DIAGNOSIS — I10 Essential (primary) hypertension: Secondary | ICD-10-CM | POA: Diagnosis not present

## 2021-11-28 DIAGNOSIS — I25119 Atherosclerotic heart disease of native coronary artery with unspecified angina pectoris: Secondary | ICD-10-CM | POA: Diagnosis not present

## 2021-11-28 DIAGNOSIS — R079 Chest pain, unspecified: Secondary | ICD-10-CM | POA: Diagnosis not present

## 2021-11-28 DIAGNOSIS — E785 Hyperlipidemia, unspecified: Secondary | ICD-10-CM | POA: Diagnosis not present

## 2021-11-28 NOTE — Assessment & Plan Note (Signed)
BP borderline.  He notes his BP is usually optimal.  Continue Losartan 50 mg once daily, Metoprolol tartrate 50 mg twice daily.

## 2021-11-28 NOTE — Assessment & Plan Note (Signed)
Recent labs obtained during Doctors' Day which are scanned into his chart are reviewed and interpreted. LDL optimal.  Continue Crestor 40 mg once daily, Zetia 10 mg once daily.

## 2021-11-28 NOTE — Patient Instructions (Signed)
Medication Instructions:  Your physician recommends that you continue on your current medications as directed. Please refer to the Current Medication list given to you today.  *If you need a refill on your cardiac medications before your next appointment, please call your pharmacy*   Lab Work: None ordered  If you have labs (blood work) drawn today and your tests are completely normal, you will receive your results only by: [place] (if you have MyChart) OR A paper copy in the mail If you have any lab test that is abnormal or we need to change your treatment, we will call you to review the results.   Testing/Procedures: A chest x-ray takes a picture of the organs and structures inside the chest, including the heart, lungs, and blood vessels. This test can show several things, including, whether the heart is enlarges; whether fluid is building up in the lungs; and whether pacemaker / defibrillator leads are still in place. Go to [address] TODAY   Your physician has requested that you have a lexiscan myoview. For further information please visit [email or web address]. Please follow instruction sheet, BELOW:   You are scheduled for a Myocardial Perfusion Imaging Study  Please arrive 15 minutes prior to your appointment time for registration and insurance purposes.  The test will take approximately 3 to 4 hours to complete; you may bring reading material.  If someone comes with you to your appointment, they will need to remain in the main lobby due to limited space in the testing area. **If you are pregnant or breastfeeding, please notify the nuclear lab prior to your appointment**  How to prepare for your Myocardial Perfusion Test: Do not eat or drink 3 hours prior to your test, except you may have water. Do not consume products containing caffeine (regular or decaffeinated) 12 hours prior to your test. (ex: coffee, chocolate, sodas, tea). Do  bring a list of your current medications with you.  If not listed below, you may take your medications as normal. Do wear comfortable clothes (no dresses or overalls) and walking shoes, tennis shoes preferred (No heels or open toe shoes are allowed). Do NOT wear cologne, perfume, aftershave, or lotions (deodorant is allowed). If these instructions are not followed, your test will have to be rescheduled.      Follow-Up: At [facility], you and your health needs are our priority.  As part of our continuing mission to provide you with exceptional heart care, we have created designated Provider Care Teams.  These Care Teams include your primary Cardiologist (physician) and Advanced Practice Providers (APPs -  Physician Assistants and Nurse Practitioners) who all work together to provide you with the care you need, when you need it.  We recommend signing up for the patient portal called "MyChart".  Sign up information is provided on this After Visit Summary.  MyChart is used to connect with patients for Virtual Visits (Telemedicine).  Patients are able to view lab/test results, encounter notes, upcoming appointments, etc.  Non-urgent messages can be sent to your provider as well.   To learn more about what you can do with MyChart, go to [email or web address].    Your next appointment:   6 month(s)  The format for your next appointment:   In Person  Provider:   Sherren Mocha, MD  or Richardson Dopp, PA-C         Other Instructions   Important Information About Sugar

## 2021-11-28 NOTE — Assessment & Plan Note (Signed)
He has had some L scapular and L sided chest pain recently.  It is not exertional and not like his prior angina.  His EKG is unchanged.  He is trying to increase his activity.  I recommend proceeding with a stress test to further assess for ischemia.  I have also recommended proceeding with a CXR.  Exercise Myoview  CXR

## 2021-11-28 NOTE — Assessment & Plan Note (Signed)
S/p NSTEMI in 2013 tx with DES x 2 to the RCA and subsequent DES to the ost/prox RCA in 2021.  He is currently having chest pain and L scapular pain that is non-exertional.  Proceed with stress testing and CXR as noted.  If his Myoview is high risk, we will need to pursue cardiac catheterization.  Continue DAPT (multiple stents in RCA) with ASA 81 mg once daily, Plavix 75 mg once daily, Zetia 10 mg once daily, Crestor 40 mg once daily, Metoprolol tartrate 50 mg twice daily.  F/u 6 mos or sooner if Myoview abnormal or symptoms change.

## 2021-11-28 NOTE — Progress Notes (Signed)
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1946, MRN [id number]  PCP:  Ginger Organ., MD  [facility] Providers Cardiologist:  Sherren Mocha, MD Cardiology APP:  Sharmon Revere     Referring MD: Ginger Organ., MD   Chief Complaint:  Chest Pain    Patient Profile: Coronary artery disease S/p NSTEMI in 5/13 ([facility] in [place], [place]) >> PCI: DES x 2 to mid and dist RCA Cath 6/14: RCA stents patent [place] >> Cath 5/21: oRCA 90, pRCA 80, mRCA stent ok >> PCI: DES to ost and prox RCA Hypertension  Hyperlipidemia  Cervical DDD Carotid stenosis [place] 6/23: Bilat ICA 1-39   Prior CV Studies: Carotid US 11/15/21 Bilateral ICA 1-39   Cardiac catheterization 10/29/19 LM ost 25 LAD ost 30, mid 65 w/ side br in 2nd septal 31; D2 40 LCx ost 40 RCA ost 90, prox 80, mid stent patent with 30 ISR; RPDA 40 EF 50-55, mild inf-apical HK PCI: 3 x 34 mm Resolute Onyx DES to ost and prox RCA        History of Present Illness:   Robert Limes, 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  He was last seen in Oct 2022.  He contacted me recently with fatigue and chest pain.  He is seen for further evaluation.  He notes L scapular pain that is not really associated with exertion.  He has some L chest pain as well (inframammary area) that sometimes occurs with it.  It may feel a little tender to palpation.  He has not had assoc nausea, diaphoresis, shortness of breath.  He has not had syncope, orthopnea.  He has been trying to exercise (cardio, weights).  He has not exertional chest pain.  He does have some shoulder pain that is brought on by positional changes.  This started after lifting weights recently.  He did have L scapular pain with his MI but these symptoms are not like his MI.      Past Medical History:  Diagnosis Date   Allergy    Bladder diverticulum    CAD (coronary artery disease) cardiologist-  dr cooper   NSTEMI  10/20/11: LHC at [facility] in [place], [place]: mRCA 70% and 80% hazy, dRCA 90%, EF 65%; >>  PCI: Promus DES x 2 (3x38 mm and 3x12 mm) to mid and distal RCA;  b.  LHC (6/14): mid and distal RCA stents patent // Cath 5/21: oLM 25, oLAD 95, mLAD 76 w side br in 2nd septal 33; D2 50, oLCx 42, mRCA stent ok w 20 ISR, RPDA 40; ost and prox RCA 90, 80 >> PCI: DES to ost/prox RCA    Cataract    removed bilaterally    Cervical disc disease    C 6 L radiculopathy on L   Cervical radiculopathy at C6    left side   Gross hematuria    Heart murmur    MVP   History of adenomatous polyp of colon    05-17-2015  tubular adenomas   History of non-ST elevation myocardial infarction (NSTEMI)    10-20-2011  s/p  cardiac cath in [place], [place]   HTN (hypertension)    Hx of adenomatous colonic polyps 05/23/2015   Hyperlipidemia    Myocardial infarction [facility])    NSTEMI- 2 stents    OA (osteoarthritis)    Positive PPD  S/P drug eluting coronary stent placement    10-21-2011  at [facility] in [place], [place]  DES x2 to distal and mid RCA   Seasonal allergies    Urgency of urination    Current Medications: Current Meds  Medication Sig   aspirin 81 MG tablet Take 81 mg by mouth daily.   clopidogrel (PLAVIX) 75 MG tablet Take 1 tablet (75 mg total) by mouth daily.   ezetimibe (ZETIA) 10 MG tablet Take 1 tablet (10 mg total) by mouth daily.   losartan (COZAAR) 50 MG tablet Take 1 tablet (50 mg total) by mouth daily.   metoprolol tartrate (LOPRESSOR) 50 MG tablet Take 1 tablet (50 mg total) by mouth 2 (two) times daily.   nitroGLYCERIN (NITROSTAT) 0.4 MG SL tablet Place 1 tablet (0.4 mg total) under the tongue every 5 (five) minutes as needed for chest pain   rosuvastatin (CRESTOR) 40 MG tablet TAKE 1 TABLET BY MOUTH DAILY    Allergies:   Eugenol   Social History   Tobacco Use   Smoking status: Never   Smokeless tobacco: Never  Vaping Use   Vaping Use: Never used  Substance Use Topics    Alcohol use: Yes    Alcohol/week: 0.0 standard drinks of alcohol    Comment: OCCASIONAL   Drug use: No    Family Hx: The patient's family history includes Alcohol abuse in his father and mother; Dementia in his paternal grandmother; Diabetes in his brother and sister; Heart attack (age of onset: 54) in his father; Rheum arthritis in his paternal grandfather. There is no history of Colon cancer, Colon polyps, Esophageal cancer, Rectal cancer, or Stomach cancer.  Review of Systems  Constitutional: Negative for fever.  Respiratory:  Negative for cough.      EKGs/Labs/Other Test Reviewed:    EKG:  EKG is   ordered today.  The ekg ordered today demonstrates normal sinus rhythm, HR 61, normal axis, j point elevation, QTc 404 (no change from prior tracings).   Recent Labs: No results found for requested labs within last 365 days.   Recent Lipid Panel No results for input(s): "CHOL", "TRIG", "HDL", "VLDL", "LDLCALC", "LDLDIRECT" in the last 8760 hours.   Risk Assessment/Calculations/Metrics:              Physical Exam:    VS:  BP 140/66   Pulse 61   Ht '5\' 7"'$  (1.702 m)   Wt 162 lb 12.8 oz (73.8 kg)   SpO2 97%   BMI 25.50 kg/m     Wt Readings from Last 3 Encounters:  11/28/21 162 lb 12.8 oz (73.8 kg)  03/21/21 162 lb 6.4 oz (73.7 kg)  11/13/19 160 lb 12.8 oz (72.9 kg)    Constitutional:      Appearance: Healthy appearance. Not in distress.  Neck:     Vascular: JVD normal.  Pulmonary:     Effort: Pulmonary effort is normal.     Breath sounds: No wheezing. No rales.  Chest:     Chest wall: Not tender to palpatation.  Cardiovascular:     Normal rate. Regular rhythm. Normal S1. Normal S2.      Murmurs: There is no murmur.  Edema:    Peripheral edema absent.  Abdominal:     Palpations: Abdomen is soft.  Skin:    General: Skin is warm and dry.  Neurological:     General: No focal deficit present.     Mental Status: Alert and oriented to person, place  and time.          ASSESSMENT & PLAN:   Precordial pain He has had some L scapular and L sided chest pain recently.  It is not exertional and not like his prior angina.  His EKG is unchanged.  He is trying to increase his activity.  I recommend proceeding with a stress test to further assess for ischemia.  I have also recommended proceeding with a CXR. Exercise Myoview CXR  CAD (coronary artery disease) S/p NSTEMI in 2013 tx with DES x 2 to the RCA and subsequent DES to the ost/prox RCA in 2021.  He is currently having chest pain and L scapular pain that is non-exertional.  Proceed with stress testing and CXR as noted.  If his Myoview is high risk, we will need to pursue cardiac catheterization.  Continue DAPT (multiple stents in RCA) with ASA 81 mg once daily, Plavix 75 mg once daily, Zetia 10 mg once daily, Crestor 40 mg once daily, Metoprolol tartrate 50 mg twice daily.  F/u 6 mos or sooner if Myoview abnormal or symptoms change.   Essential hypertension BP borderline.  He notes his BP is usually optimal.  Continue Losartan 50 mg once daily, Metoprolol tartrate 50 mg twice daily.   Hyperlipidemia LDL goal <70 Recent labs obtained during Doctors' Day which are scanned into his chart are reviewed and interpreted. LDL optimal.  Continue Crestor 40 mg once daily, Zetia 10 mg once daily.         Shared Decision Making/Informed Consent The risks [chest pain, shortness of breath, cardiac arrhythmias, dizziness, blood pressure fluctuations, myocardial infarction, stroke/transient ischemic attack, nausea, vomiting, allergic reaction, radiation exposure, metallic taste sensation and life-threatening complications (estimated to be 1 in 10,000)], benefits (risk stratification, diagnosing coronary artery disease, treatment guidance) and alternatives of a nuclear stress test were discussed in detail with Dr. Linna Darner and he agrees to proceed.   Dispo:  Return in about 6 months (around 05/30/2022) for Routine Follow Up, w/ Dr.  Burt Knack, or Richardson Dopp, PA-C.   Medication Adjustments/Labs and Tests Ordered: Current medicines are reviewed at length with the patient today.  Concerns regarding medicines are outlined above.  Tests Ordered: Orders Placed This Encounter  Procedures   DG Chest 2 View   Cardiac Stress Test: Informed Consent Details: Physician/Practitioner Attestation; Transcribe to consent form and obtain patient signature   MYOCARDIAL PERFUSION IMAGING   EKG 12-Lead   Medication Changes: No orders of the defined types were placed in this encounter.  Signed, Richardson Dopp, PA-C  11/28/2021 10:32 AM    [facility] [place], [place], [place]  [address] Phone: [phone]; Fax: [phone]

## 2021-11-29 ENCOUNTER — Encounter (HOSPITAL_COMMUNITY): Payer: Self-pay | Admitting: *Deleted

## 2021-12-04 ENCOUNTER — Telehealth (HOSPITAL_COMMUNITY): Payer: Self-pay | Admitting: *Deleted

## 2021-12-05 ENCOUNTER — Telehealth (HOSPITAL_COMMUNITY): Payer: Self-pay | Admitting: *Deleted

## 2021-12-06 ENCOUNTER — Ambulatory Visit (HOSPITAL_COMMUNITY): Payer: PPO | Attending: Physician Assistant

## 2021-12-06 DIAGNOSIS — R072 Precordial pain: Secondary | ICD-10-CM | POA: Diagnosis not present

## 2021-12-06 LAB — MYOCARDIAL PERFUSION IMAGING
Angina Index: 0
Estimated workload: 7.3
Exercise duration (min): 7 min
Exercise duration (sec): 30 s
LV dias vol: 88 mL (ref 62–150)
LV sys vol: 32 mL
MPHR: 144 {beats}/min
Nuc Stress EF: 64 %
Peak HR: 125 {beats}/min
Percent HR: 86 %
Rest HR: 75 {beats}/min
Rest Nuclear Isotope Dose: 11 mCi
SDS: 1
SRS: 0
SSS: 1
Stress Nuclear Isotope Dose: 32.6 mCi
TID: 1.06

## 2021-12-06 MED ORDER — TECHNETIUM TC 99M TETROFOSMIN IV KIT
32.6000 | PACK | Freq: Once | INTRAVENOUS | Status: AC | PRN
  Administered 2021-12-06: 32.6 via INTRAVENOUS

## 2021-12-06 MED ORDER — TECHNETIUM TC 99M TETROFOSMIN IV KIT
11.0000 | PACK | Freq: Once | INTRAVENOUS | Status: AC | PRN
  Administered 2021-12-06: 11 via INTRAVENOUS

## 2021-12-19 ENCOUNTER — Other Ambulatory Visit (HOSPITAL_COMMUNITY): Payer: Self-pay

## 2021-12-26 ENCOUNTER — Other Ambulatory Visit (HOSPITAL_COMMUNITY): Payer: Self-pay

## 2022-01-15 DIAGNOSIS — E785 Hyperlipidemia, unspecified: Secondary | ICD-10-CM | POA: Diagnosis not present

## 2022-01-15 DIAGNOSIS — R7989 Other specified abnormal findings of blood chemistry: Secondary | ICD-10-CM | POA: Diagnosis not present

## 2022-01-15 DIAGNOSIS — R7301 Impaired fasting glucose: Secondary | ICD-10-CM | POA: Diagnosis not present

## 2022-01-15 DIAGNOSIS — I10 Essential (primary) hypertension: Secondary | ICD-10-CM | POA: Diagnosis not present

## 2022-01-15 DIAGNOSIS — Z125 Encounter for screening for malignant neoplasm of prostate: Secondary | ICD-10-CM | POA: Diagnosis not present

## 2022-01-18 ENCOUNTER — Other Ambulatory Visit (HOSPITAL_COMMUNITY): Payer: Self-pay

## 2022-01-18 MED ORDER — AMOXICILLIN 500 MG PO CAPS
500.0000 mg | ORAL_CAPSULE | Freq: Three times a day (TID) | ORAL | 0 refills | Status: DC
  Filled 2022-01-18: qty 21, 7d supply, fill #0

## 2022-01-22 DIAGNOSIS — I6523 Occlusion and stenosis of bilateral carotid arteries: Secondary | ICD-10-CM | POA: Diagnosis not present

## 2022-01-22 DIAGNOSIS — I2510 Atherosclerotic heart disease of native coronary artery without angina pectoris: Secondary | ICD-10-CM | POA: Diagnosis not present

## 2022-01-22 DIAGNOSIS — E785 Hyperlipidemia, unspecified: Secondary | ICD-10-CM | POA: Diagnosis not present

## 2022-01-22 DIAGNOSIS — Z1339 Encounter for screening examination for other mental health and behavioral disorders: Secondary | ICD-10-CM | POA: Diagnosis not present

## 2022-01-22 DIAGNOSIS — Z1331 Encounter for screening for depression: Secondary | ICD-10-CM | POA: Diagnosis not present

## 2022-01-22 DIAGNOSIS — Z86010 Personal history of colonic polyps: Secondary | ICD-10-CM | POA: Diagnosis not present

## 2022-01-22 DIAGNOSIS — R7301 Impaired fasting glucose: Secondary | ICD-10-CM | POA: Diagnosis not present

## 2022-01-22 DIAGNOSIS — R809 Proteinuria, unspecified: Secondary | ICD-10-CM | POA: Diagnosis not present

## 2022-01-22 DIAGNOSIS — I10 Essential (primary) hypertension: Secondary | ICD-10-CM | POA: Diagnosis not present

## 2022-01-22 DIAGNOSIS — Z0000 Encounter for general adult medical examination without abnormal findings: Secondary | ICD-10-CM | POA: Diagnosis not present

## 2022-01-22 DIAGNOSIS — R82998 Other abnormal findings in urine: Secondary | ICD-10-CM | POA: Diagnosis not present

## 2022-01-22 DIAGNOSIS — N323 Diverticulum of bladder: Secondary | ICD-10-CM | POA: Diagnosis not present

## 2022-02-02 ENCOUNTER — Ambulatory Visit: Payer: PPO | Admitting: Podiatrist

## 2022-02-02 ENCOUNTER — Encounter: Payer: Self-pay | Admitting: Podiatrist

## 2022-02-02 DIAGNOSIS — L84 Corns and callosities: Secondary | ICD-10-CM

## 2022-02-02 NOTE — Progress Notes (Signed)
  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  He relates he wore a pair of thin soled shoes and noticed the problem after wearing the shoes.  He has tried no treatment.  He getting ready to go on a trip to [place] and hopes to have this feeling better before he leaves for his trip.   Current Outpatient Medications on File Prior to Visit  Medication Sig Dispense Refill   amoxicillin (AMOXIL) 500 MG capsule Take 1 capsule (500 mg total) by mouth 3 (three) times daily begin 2 days prior to surgery 21 capsule 0   aspirin 81 MG tablet Take 81 mg by mouth daily.     clopidogrel (PLAVIX) 75 MG tablet Take 1 tablet (75 mg total) by mouth daily. 90 tablet 1   ezetimibe (ZETIA) 10 MG tablet Take 1 tablet (10 mg total) by mouth daily. 90 tablet 3   losartan (COZAAR) 50 MG tablet Take 1 tablet (50 mg total) by mouth daily. 90 tablet 3   metoprolol tartrate (LOPRESSOR) 50 MG tablet Take 1 tablet (50 mg total) by mouth 2 (two) times daily. 180 tablet 3   nitroGLYCERIN (NITROSTAT) 0.4 MG SL tablet Place 1 tablet (0.4 mg total) under the tongue every 5 (five) minutes as needed for chest pain 25 tablet 3   rosuvastatin (CRESTOR) 40 MG tablet TAKE 1 TABLET BY MOUTH DAILY 90 tablet 3   No current facility-administered medications on file prior to visit.    Allergies  Allergen Reactions   Eugenol Other (See Comments)    Topical dental anesthetic caused migraine type headache    Review of Systems No fevers, chills, nausea, muscle aches, no difficulty breathing, no calf pain, no chest pain or shortness of breath.   Physical Exam  GENERAL APPEARANCE: Alert, conversant. Appropriately groomed. No acute distress.   VASCULAR: Pedal pulses palpable 2/4 DP and 2/4 PT bilateral.  Capillary refill time is immediate to all digits,   Proximal to distal cooling it warm to warm.  Digital perfusion adequate.   NEUROLOGIC: Sensation grossly intact bilateral  MUSCULOSKELETAL: acceptable muscle strength, tone and stability bilateral.  No gross boney pedal deformities noted.  DERMATOLOGIC: skin is warm, supple, and dry.  Color, texture, and turgor of skin within normal limits.  On the plantar lateral aspect of the left foot is a well-circumscribed keratotic lesion with a groundglass core.  It is painful with direct pressure.  Skin tension lines are noted throughout.     Assessment     ICD-10-CM   1. Callus of foot  L84        Plan  Exam findings and treatment recommendations were discussed.  At today's visit I did excise the porokeratotic lesion with a 15 blade without complication applied Salinocaine and tape.  Recommended continued use of good supportive shoes in the future.  Also recommended he be seen back if the lesion returns or fails to improve.

## 2022-02-13 ENCOUNTER — Other Ambulatory Visit (HOSPITAL_COMMUNITY): Payer: Self-pay

## 2022-03-02 ENCOUNTER — Other Ambulatory Visit (HOSPITAL_COMMUNITY): Payer: Self-pay

## 2022-03-02 MED ORDER — ALBUTEROL SULFATE HFA 108 (90 BASE) MCG/ACT IN AERS
2.0000 | INHALATION_SPRAY | RESPIRATORY_TRACT | 0 refills | Status: DC
  Filled 2022-03-02: qty 6.7, 17d supply, fill #0

## 2022-03-02 MED ORDER — PREDNISONE 20 MG PO TABS
40.0000 mg | ORAL_TABLET | Freq: Every day | ORAL | 0 refills | Status: DC
  Filled 2022-03-02: qty 12, 6d supply, fill #0

## 2022-03-20 ENCOUNTER — Other Ambulatory Visit: Payer: Self-pay | Admitting: Physician Assistant

## 2022-03-20 ENCOUNTER — Other Ambulatory Visit (HOSPITAL_COMMUNITY): Payer: Self-pay

## 2022-03-20 DIAGNOSIS — I25118 Atherosclerotic heart disease of native coronary artery with other forms of angina pectoris: Secondary | ICD-10-CM

## 2022-03-20 MED ORDER — METOPROLOL TARTRATE 50 MG PO TABS
50.0000 mg | ORAL_TABLET | Freq: Two times a day (BID) | ORAL | 2 refills | Status: DC
  Filled 2022-03-20: qty 180, 90d supply, fill #0
  Filled 2022-06-18: qty 180, 90d supply, fill #1
  Filled 2022-09-20: qty 180, 90d supply, fill #2

## 2022-04-04 ENCOUNTER — Other Ambulatory Visit: Payer: Self-pay | Admitting: Cardiovascular Disease

## 2022-04-05 ENCOUNTER — Other Ambulatory Visit (HOSPITAL_COMMUNITY): Payer: Self-pay

## 2022-04-05 MED ORDER — CLOPIDOGREL BISULFATE 75 MG PO TABS
75.0000 mg | ORAL_TABLET | Freq: Every day | ORAL | 2 refills | Status: DC
  Filled 2022-04-05: qty 90, 90d supply, fill #0
  Filled 2022-06-18: qty 90, 90d supply, fill #1
  Filled 2022-09-25: qty 90, 90d supply, fill #2

## 2022-05-23 ENCOUNTER — Other Ambulatory Visit: Payer: Self-pay

## 2022-05-23 ENCOUNTER — Encounter: Payer: Self-pay | Admitting: Cardiovascular Disease

## 2022-05-23 ENCOUNTER — Other Ambulatory Visit (HOSPITAL_COMMUNITY): Payer: Self-pay

## 2022-05-23 ENCOUNTER — Ambulatory Visit: Payer: PPO | Attending: Cardiovascular Disease | Admitting: Cardiovascular Disease

## 2022-05-23 VITALS — BP 150/82 | HR 75 | Ht 67.0 in | Wt 160.6 lb

## 2022-05-23 DIAGNOSIS — I2510 Atherosclerotic heart disease of native coronary artery without angina pectoris: Secondary | ICD-10-CM | POA: Diagnosis not present

## 2022-05-23 DIAGNOSIS — E785 Hyperlipidemia, unspecified: Secondary | ICD-10-CM | POA: Diagnosis not present

## 2022-05-23 DIAGNOSIS — I10 Essential (primary) hypertension: Secondary | ICD-10-CM | POA: Diagnosis not present

## 2022-05-23 MED ORDER — EZETIMIBE 10 MG PO TABS
10.0000 mg | ORAL_TABLET | Freq: Every day | ORAL | 4 refills | Status: DC
  Filled 2022-05-23: qty 90, 90d supply, fill #0
  Filled 2022-09-20: qty 90, 90d supply, fill #1
  Filled 2022-12-22: qty 90, 90d supply, fill #2
  Filled 2023-03-21: qty 90, 90d supply, fill #3

## 2022-05-23 MED ORDER — NITROGLYCERIN 0.4 MG SL SUBL
0.4000 mg | SUBLINGUAL_TABLET | SUBLINGUAL | 6 refills | Status: AC | PRN
  Filled 2022-05-23: qty 25, 1d supply, fill #0

## 2022-05-23 NOTE — Progress Notes (Signed)
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  He denies any chest pain, chest pressure, or shortness of breath.  He is compliant with his medications.  We note that his blood pressure is elevated today but he has not taken his losartan or metoprolol yet because he has not eaten.  Blood pressure is normally well-controlled.  No other issues reported.  Past Medical History:  Diagnosis Date   Allergy    Bladder diverticulum    CAD (coronary artery disease) cardiologist-  dr Lynnsey Barbara   NSTEMI 10/20/11: LHC at [facility] in [place], [place]: mRCA 70% and 80% hazy, dRCA 90%, EF 65%; >>  PCI: Promus DES x 2 (3x38 mm and 3x12 mm) to mid and distal RCA;  b.  LHC (6/14): mid and distal RCA stents patent // Cath 5/21: oLM 25, oLAD 29, mLAD 2 w side br in 2nd septal 50; D2 40, oLCx 50, mRCA stent ok w 90 ISR, RPDA 40; ost and prox RCA 90, 80 >> PCI: DES to ost/prox RCA    Cataract    removed bilaterally    Cervical disc disease    C 6 L radiculopathy on L   Cervical radiculopathy at C6    left side   Gross hematuria    Heart murmur    MVP   History of adenomatous polyp of colon    05-17-2015  tubular adenomas   History of non-ST elevation myocardial infarction (NSTEMI)    10-20-2011   s/p  cardiac cath in [place], [place]   HTN (hypertension)    Hx of adenomatous colonic polyps 05/23/2015   Hyperlipidemia    Myocardial infarction [facility])    NSTEMI- 2 stents    OA (osteoarthritis)    Positive PPD    S/P drug eluting coronary stent placement    10-21-2011  at [facility] in [place], [place]  DES x2 to distal and mid RCA   Seasonal allergies    Urgency of urination     Past Surgical History:  Procedure Laterality Date   CATARACT EXTRACTION W/ INTRAOCULAR LENS  IMPLANT, BILATERAL  2014  and  2016   COLONOSCOPY  last one 05-17-2015   CORONARY ANGIOPLASTY WITH STENT PLACEMENT  10-21-2011  in [place], [place]   DES to mid and distal RCA   CORONARY STENT INTERVENTION N/A 10/29/2019   Procedure: CORONARY STENT INTERVENTION;  Surgeon: Sherren Mocha, MD;  Location: [place] [facility];  Service: Cardiovascular;  Laterality: N/A;   CYSTOSCOPY WITH FULGERATION N/A 02/17/2016   Procedure: CYSTOSCOPY with fulgeration of bleeders;  Surgeon: Kathie Rhodes, MD;  Location: [place];  Service: Urology;  Laterality: N/A;rigid and flexible    INGUINAL HERNIA REPAIR Left 06/20/2015   Procedure: LEFT INGUINAL HERNIA REPAIR WITH MESH;  Surgeon: Rolm Bookbinder, MD;  Location: [place];  Service: General;  Laterality: Left;   INSERTION OF MESH Left 06/20/2015   Procedure: INSERTION OF MESH;  Surgeon: Rolm Bookbinder, MD;  Location: [place];  Service: General;  Laterality: Left;   LEFT HEART CATH AND CORONARY ANGIOGRAPHY N/A 10/29/2019   Procedure: LEFT HEART CATH AND CORONARY ANGIOGRAPHY;  Surgeon: Sherren Mocha, MD;  Location: [place] [facility];  Service: Cardiovascular;  Laterality: N/A;   LEFT HEART CATHETERIZATION WITH CORONARY ANGIOGRAM N/A 11/14/2012   Procedure: LEFT HEART CATHETERIZATION WITH CORONARY ANGIOGRAM;  Surgeon: Sherren Mocha, MD;  Location: [facility] C[facility] LAB;  Service: Cardiovascular;  Laterality: N/A;  patent mid and distal  RCA stent's;  ostial CFX 30%,  mLAD 40-50%,  pPDA 40%,  ef55%   TONSILLECTOMY AND ADENOIDECTOMY  child   WISDOM TOOTH EXTRACTION  1972    Current Medications: Current Meds  Medication Sig   aspirin 81 MG tablet Take 81 mg by mouth daily.   clopidogrel (PLAVIX) 75 MG tablet Take 1 tablet (75 mg total) by mouth daily.   ezetimibe (ZETIA) 10 MG tablet Take 1 tablet (10 mg total) by mouth daily.   losartan (COZAAR) 50 MG tablet Take 1 tablet (50 mg total) by mouth daily.   metoprolol tartrate (LOPRESSOR) 50 MG tablet Take 1 tablet (50 mg total) by mouth 2 (two) times daily.   rosuvastatin (CRESTOR) 40 MG tablet TAKE 1 TABLET BY MOUTH DAILY   [DISCONTINUED] nitroGLYCERIN (NITROSTAT) 0.4 MG SL tablet Place 1 tablet (0.4 mg total) under the tongue every 5 (five) minutes as needed for chest pain     Allergies:   Eugenol   Social History   Socioeconomic History   Marital status: Married    Spouse name: Not on file   Number of children: Not on file   Years of education: Not on file   Highest education level: Not on file  Occupational History   Not on file  Tobacco Use   Smoking status: Never   Smokeless tobacco: Never  Vaping Use   Vaping Use: Never used  Substance and Sexual Activity   Alcohol use: Yes    Alcohol/week: 0.0 standard drinks of alcohol    Comment: OCCASIONAL   Drug use: No   Sexual activity: Not on file  Other Topics Concern   Not on file  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   Social Determinants of Health   Financial Resource Strain: Not on file  Food Insecurity: Not on file  Transportation Needs: Not on file  Physical Activity: Not on file  Stress: Not on file  Social Connections: Not on file     Family History: The patient's family history includes Alcohol abuse in his father and mother; Dementia in his paternal grandmother; Diabetes in his brother and sister; Heart attack (age of onset: 75) in his father; Rheum arthritis in his paternal  grandfather. There is no history of Colon cancer, Colon polyps, Esophageal cancer, Rectal cancer, or Stomach cancer.  ROS:   Please see the history of present illness.    All other systems reviewed and are negative.  EKGs/Labs/Other Studies Reviewed:    The following studies were reviewed today: Cardiac Cath 10/29/2019: 1.  Severe stenosis involving the ostium and proximal portions of the RCA, treated successfully with PCI  using a 3.0 x 34 mm resolute Onyx DES 2.  Continued patency of the stented segment in the mid RCA 3.  Mild to moderate diffuse stenosis in the LAD and left circumflex, unchanged from the previous cardiac cath study. 4.  Mild hypokinesis of the inferoapical LV segments, otherwise normal LV contractility with LVEF 50 to 55%   Recommendations: Dual antiplatelet therapy with aspirin and clopidogrel for 12 months as tolerated.  Continued aggressive risk reduction measures.  EKG:  EKG is not ordered today.    Recent Labs: No results found for requested labs within last 365 days.  Recent Lipid Panel    Component Value Date/Time   CHOL 154 06/30/2012 1414   TRIG 125.0 06/30/2012 1414   HDL 50.40 06/30/2012 1414   CHOLHDL 3 06/30/2012 1414   VLDL 25.0 06/30/2012 1414   LDLCALC 79 06/30/2012 1414     Risk Assessment/Calculations:           Physical Exam:    VS:  BP (!) 150/82   Pulse 75   Ht '5\' 7"'$  (1.702 m)   Wt 160 lb 9.6 oz (72.8 kg)   SpO2 97%   BMI 25.15 kg/m     Wt Readings from Last 3 Encounters:  05/23/22 160 lb 9.6 oz (72.8 kg)  12/06/21 162 lb (73.5 kg)  11/28/21 162 lb 12.8 oz (73.8 kg)     GEN:  Well nourished, well developed in no acute distress HEENT: Normal NECK: No JVD; No carotid bruits LYMPHATICS: No lymphadenopathy CARDIAC: RRR, no murmurs, rubs, gallops RESPIRATORY:  Clear to auscultation without rales, wheezing or rhonchi  ABDOMEN: Soft, non-tender, non-distended MUSCULOSKELETAL:  No edema; No deformity  SKIN: Warm and  dry NEUROLOGIC:  Alert and oriented x 3 PSYCHIATRIC:  Normal affect   ASSESSMENT:    1. Essential hypertension   2. Hyperlipidemia LDL goal <70   3. Coronary artery disease involving native coronary artery of native heart without angina pectoris    PLAN:    In order of problems listed above:  Blood pressure controlled on his current medical program.  Blood pressure elevated in the office but he has not taken his losartan or metoprolol yet this morning.  He does monitor his blood pressure and reports that it is well-controlled.  Continue current medical therapy.  Most recent labs reviewed. Treated with rosuvastatin and ezetimibe.  LDL cholesterol 57 mg/dL.  Patient follows a healthy lifestyle and has recently completed a nutrition course that has changed the way he eats. Stable without symptoms of angina since his last PCI procedure in 2021.  He remains on DAPT with aspirin and clopidogrel, high intensity statin drug, and a beta-blocker.  No changes made today.  He will report any recurrent angina.  Otherwise I will see him back in 1 year for follow-up evaluation.           Medication Adjustments/Labs and Tests Ordered: Current medicines are reviewed at length with the patient today.  Concerns regarding medicines are outlined above.  No orders of the defined types were placed in this encounter.  Meds ordered this encounter  Medications   nitroGLYCERIN (NITROSTAT) 0.4 MG SL tablet    Sig: Place 1 tablet (0.4 mg total) under the tongue every 5 (five) minutes as needed. Dissolve 1 tablet under the tongue every 5 minutes as needed for chest pain. Max of 3 doses, then 911.    Dispense:  25 tablet    Refill:  6    Patient Instructions  Medication Instructions:  Your physician recommends that you continue on your current medications as directed. Please refer to the Current Medication list given to you today.  *If you need a refill on your cardiac medications before your next  appointment, please call your pharmacy*   Lab Work: NONE If you have labs (blood work) drawn today and your tests are completely normal, you will receive your results only by: [place] (if you have MyChart) OR A paper copy in the mail If you have any lab test that is abnormal or we need to change your treatment, we will call you to review the results.   Testing/Procedures: NONE   Follow-Up: At [facility], you and your health needs are our priority.  As part of our continuing mission to provide you with exceptional heart care, we have created designated Provider Care Teams.  These Care Teams include your primary Cardiologist (physician) and Advanced Practice Providers (APPs -  Physician Assistants and Nurse Practitioners) who all work together to provide you with the care you need, when you need it.  We recommend signing up for the patient portal called "MyChart".  Sign up information is provided on this After Visit Summary.  MyChart is used to connect with patients for Virtual Visits (Telemedicine).  Patients are able to view lab/test results, encounter notes, upcoming appointments, etc.  Non-urgent messages can be sent to your provider as well.   To learn more about what you can do with MyChart, go to [email or web address].    Your next appointment:   1 year(s)  The format for your next appointment:   In Person  Provider:   Sherren Mocha, MD       Important Information About Sugar         Signed, Sherren Mocha, MD  05/23/2022 1:43 PM    [place]

## 2022-05-23 NOTE — Patient Instructions (Signed)
Medication Instructions:  Your physician recommends that you continue on your current medications as directed. Please refer to the Current Medication list given to you today.  *If you need a refill on your cardiac medications before your next appointment, please call your pharmacy*   Lab Work: NONE If you have labs (blood work) drawn today and your tests are completely normal, you will receive your results only by: MyChart Message (if you have MyChart) OR A paper copy in the mail If you have any lab test that is abnormal or we need to change your treatment, we will call you to review the results.   Testing/Procedures: NONE   Follow-Up: At [facility], you and your health needs are our priority.  As part of our continuing mission to provide you with exceptional heart care, we have created designated Provider Care Teams.  These Care Teams include your primary Cardiologist (physician) and Advanced Practice Providers (APPs -  Physician Assistants and Nurse Practitioners) who all work together to provide you with the care you need, when you need it.  We recommend signing up for the patient portal called "MyChart".  Sign up information is provided on this After Visit Summary.  MyChart is used to connect with patients for Virtual Visits (Telemedicine).  Patients are able to view lab/test results, encounter notes, upcoming appointments, etc.  Non-urgent messages can be sent to your provider as well.   To learn more about what you can do with MyChart, go to [email or web address].    Your next appointment:   1 year(s)  The format for your next appointment:   In Person  Provider:   Michael Cooper, MD       Important Information About Sugar       

## 2022-06-13 DIAGNOSIS — Z961 Presence of intraocular lens: Secondary | ICD-10-CM | POA: Diagnosis not present

## 2022-06-13 DIAGNOSIS — H04123 Dry eye syndrome of bilateral lacrimal glands: Secondary | ICD-10-CM | POA: Diagnosis not present

## 2022-06-13 DIAGNOSIS — H26492 Other secondary cataract, left eye: Secondary | ICD-10-CM | POA: Diagnosis not present

## 2022-06-13 DIAGNOSIS — H5702 Anisocoria: Secondary | ICD-10-CM | POA: Diagnosis not present

## 2022-06-13 DIAGNOSIS — H02052 Trichiasis without entropian right lower eyelid: Secondary | ICD-10-CM | POA: Diagnosis not present

## 2022-06-19 ENCOUNTER — Other Ambulatory Visit (HOSPITAL_COMMUNITY): Payer: Self-pay

## 2022-07-11 ENCOUNTER — Other Ambulatory Visit (HOSPITAL_COMMUNITY): Payer: Self-pay

## 2022-07-11 MED ORDER — LAGEVRIO 200 MG PO CAPS
4.0000 | ORAL_CAPSULE | Freq: Two times a day (BID) | ORAL | 0 refills | Status: DC
  Filled 2022-07-11: qty 40, 5d supply, fill #0

## 2022-07-20 ENCOUNTER — Other Ambulatory Visit (HOSPITAL_COMMUNITY): Payer: Self-pay

## 2022-08-22 LAB — LAB REPORT - SCANNED
A1c: 6.2
EGFR: 91

## 2022-09-25 ENCOUNTER — Other Ambulatory Visit (HOSPITAL_COMMUNITY): Payer: Self-pay

## 2022-09-25 MED ORDER — ROSUVASTATIN CALCIUM 40 MG PO TABS
40.0000 mg | ORAL_TABLET | Freq: Every day | ORAL | 3 refills | Status: DC
  Filled 2022-09-25: qty 90, 90d supply, fill #0
  Filled 2023-01-02: qty 90, 90d supply, fill #1
  Filled 2023-03-21: qty 90, 90d supply, fill #2
  Filled 2023-07-01: qty 90, 90d supply, fill #3

## 2022-09-26 ENCOUNTER — Other Ambulatory Visit (HOSPITAL_COMMUNITY): Payer: Self-pay

## 2022-11-19 ENCOUNTER — Other Ambulatory Visit (HOSPITAL_COMMUNITY): Payer: Self-pay

## 2022-11-19 MED ORDER — LOSARTAN POTASSIUM 50 MG PO TABS
50.0000 mg | ORAL_TABLET | Freq: Every day | ORAL | 3 refills | Status: DC
  Filled 2022-11-19: qty 90, 90d supply, fill #0
  Filled 2023-02-25: qty 90, 90d supply, fill #1
  Filled 2023-05-28: qty 90, 90d supply, fill #2
  Filled 2023-07-31 – 2023-08-27 (×2): qty 90, 90d supply, fill #3

## 2022-12-22 ENCOUNTER — Other Ambulatory Visit: Payer: Self-pay | Admitting: Cardiovascular Disease

## 2022-12-22 DIAGNOSIS — I25118 Atherosclerotic heart disease of native coronary artery with other forms of angina pectoris: Secondary | ICD-10-CM

## 2022-12-24 ENCOUNTER — Other Ambulatory Visit (HOSPITAL_COMMUNITY): Payer: Self-pay

## 2022-12-24 MED ORDER — METOPROLOL TARTRATE 50 MG PO TABS
50.0000 mg | ORAL_TABLET | Freq: Two times a day (BID) | ORAL | 1 refills | Status: DC
  Filled 2022-12-24: qty 180, 90d supply, fill #0
  Filled 2023-03-21 – 2023-03-22 (×2): qty 180, 90d supply, fill #1

## 2023-01-02 ENCOUNTER — Other Ambulatory Visit (HOSPITAL_COMMUNITY): Payer: Self-pay

## 2023-01-02 ENCOUNTER — Other Ambulatory Visit: Payer: Self-pay | Admitting: Cardiovascular Disease

## 2023-01-02 MED ORDER — CLOPIDOGREL BISULFATE 75 MG PO TABS
75.0000 mg | ORAL_TABLET | Freq: Every day | ORAL | 3 refills | Status: DC
  Filled 2023-01-02: qty 90, 90d supply, fill #0
  Filled 2023-03-21: qty 90, 90d supply, fill #1
  Filled 2023-07-01: qty 90, 90d supply, fill #2
  Filled 2023-10-04: qty 90, 90d supply, fill #3

## 2023-01-03 ENCOUNTER — Other Ambulatory Visit (HOSPITAL_COMMUNITY): Payer: Self-pay

## 2023-01-03 ENCOUNTER — Other Ambulatory Visit: Payer: Self-pay

## 2023-01-03 ENCOUNTER — Encounter (INDEPENDENT_AMBULATORY_CARE_PROVIDER_SITE_OTHER): Payer: Self-pay

## 2023-01-23 ENCOUNTER — Other Ambulatory Visit (HOSPITAL_COMMUNITY): Payer: Self-pay

## 2023-02-08 ENCOUNTER — Other Ambulatory Visit (HOSPITAL_COMMUNITY): Payer: Self-pay

## 2023-02-28 ENCOUNTER — Encounter (INDEPENDENT_AMBULATORY_CARE_PROVIDER_SITE_OTHER): Payer: Self-pay

## 2023-03-13 DIAGNOSIS — H524 Presbyopia: Secondary | ICD-10-CM | POA: Diagnosis not present

## 2023-03-13 DIAGNOSIS — H52202 Unspecified astigmatism, left eye: Secondary | ICD-10-CM | POA: Diagnosis not present

## 2023-03-13 DIAGNOSIS — H26493 Other secondary cataract, bilateral: Secondary | ICD-10-CM | POA: Diagnosis not present

## 2023-03-13 DIAGNOSIS — Z961 Presence of intraocular lens: Secondary | ICD-10-CM | POA: Diagnosis not present

## 2023-03-21 ENCOUNTER — Other Ambulatory Visit: Payer: Self-pay

## 2023-03-22 ENCOUNTER — Other Ambulatory Visit (HOSPITAL_COMMUNITY): Payer: Self-pay

## 2023-03-26 ENCOUNTER — Other Ambulatory Visit (HOSPITAL_COMMUNITY): Payer: Self-pay

## 2023-05-30 ENCOUNTER — Other Ambulatory Visit (HOSPITAL_COMMUNITY): Payer: Self-pay

## 2023-05-30 DIAGNOSIS — I10 Essential (primary) hypertension: Secondary | ICD-10-CM | POA: Diagnosis not present

## 2023-05-30 DIAGNOSIS — I2510 Atherosclerotic heart disease of native coronary artery without angina pectoris: Secondary | ICD-10-CM | POA: Diagnosis not present

## 2023-05-30 DIAGNOSIS — I6523 Occlusion and stenosis of bilateral carotid arteries: Secondary | ICD-10-CM | POA: Diagnosis not present

## 2023-05-30 DIAGNOSIS — J209 Acute bronchitis, unspecified: Secondary | ICD-10-CM | POA: Diagnosis not present

## 2023-05-30 DIAGNOSIS — J4521 Mild intermittent asthma with (acute) exacerbation: Secondary | ICD-10-CM | POA: Diagnosis not present

## 2023-05-30 DIAGNOSIS — E785 Hyperlipidemia, unspecified: Secondary | ICD-10-CM | POA: Diagnosis not present

## 2023-05-30 MED ORDER — CEFDINIR 300 MG PO CAPS
300.0000 mg | ORAL_CAPSULE | Freq: Two times a day (BID) | ORAL | 0 refills | Status: DC
  Filled 2023-05-30: qty 14, 7d supply, fill #0

## 2023-05-30 MED ORDER — ALBUTEROL SULFATE HFA 108 (90 BASE) MCG/ACT IN AERS
2.0000 | INHALATION_SPRAY | RESPIRATORY_TRACT | 5 refills | Status: AC | PRN
  Filled 2023-05-30: qty 6.7, 17d supply, fill #0

## 2023-05-30 MED ORDER — PREDNISONE 20 MG PO TABS
40.0000 mg | ORAL_TABLET | Freq: Every day | ORAL | 1 refills | Status: DC
  Filled 2023-05-30: qty 12, 6d supply, fill #0

## 2023-06-28 ENCOUNTER — Other Ambulatory Visit (HOSPITAL_COMMUNITY): Payer: Self-pay

## 2023-06-28 MED ORDER — CIPROFLOXACIN HCL 500 MG PO TABS
500.0000 mg | ORAL_TABLET | Freq: Two times a day (BID) | ORAL | 0 refills | Status: AC
  Filled 2023-06-28 – 2023-07-01 (×2): qty 14, 7d supply, fill #0

## 2023-06-28 MED ORDER — TYPHIM VI 25 MCG/0.5ML IM SOSY
0.5000 mL | PREFILLED_SYRINGE | Freq: Once | INTRAMUSCULAR | 0 refills | Status: AC
  Filled 2023-06-28: qty 0.5, 1d supply, fill #0

## 2023-07-01 ENCOUNTER — Encounter: Payer: Self-pay | Admitting: Internal Medicine

## 2023-07-01 ENCOUNTER — Other Ambulatory Visit (HOSPITAL_COMMUNITY): Payer: Self-pay

## 2023-07-01 ENCOUNTER — Other Ambulatory Visit: Payer: Self-pay | Admitting: Cardiovascular Disease

## 2023-07-01 DIAGNOSIS — I25118 Atherosclerotic heart disease of native coronary artery with other forms of angina pectoris: Secondary | ICD-10-CM

## 2023-07-01 MED ORDER — EZETIMIBE 10 MG PO TABS
10.0000 mg | ORAL_TABLET | Freq: Every day | ORAL | 4 refills | Status: DC
  Filled 2023-07-01: qty 90, 90d supply, fill #0
  Filled 2023-07-31 – 2023-10-04 (×2): qty 90, 90d supply, fill #1
  Filled 2024-01-06: qty 90, 90d supply, fill #2
  Filled 2024-04-06: qty 90, 90d supply, fill #3

## 2023-07-02 ENCOUNTER — Other Ambulatory Visit (HOSPITAL_COMMUNITY): Payer: Self-pay

## 2023-07-02 MED ORDER — METOPROLOL TARTRATE 50 MG PO TABS
50.0000 mg | ORAL_TABLET | Freq: Two times a day (BID) | ORAL | 0 refills | Status: DC
  Filled 2023-07-02 (×2): qty 60, 30d supply, fill #0

## 2023-07-09 ENCOUNTER — Telehealth: Payer: Self-pay | Admitting: Internal Medicine

## 2023-07-09 ENCOUNTER — Other Ambulatory Visit (HOSPITAL_COMMUNITY): Payer: Self-pay

## 2023-07-09 ENCOUNTER — Telehealth: Payer: Self-pay | Admitting: *Deleted

## 2023-07-09 DIAGNOSIS — I25118 Atherosclerotic heart disease of native coronary artery with other forms of angina pectoris: Secondary | ICD-10-CM

## 2023-07-09 MED ORDER — METOPROLOL TARTRATE 50 MG PO TABS
50.0000 mg | ORAL_TABLET | Freq: Two times a day (BID) | ORAL | 0 refills | Status: DC
  Filled 2023-07-09: qty 180, 90d supply, fill #0

## 2023-07-09 MED ORDER — ROSUVASTATIN CALCIUM 40 MG PO TABS
40.0000 mg | ORAL_TABLET | Freq: Every day | ORAL | 0 refills | Status: DC
  Filled 2023-07-09 – 2023-09-30 (×3): qty 90, 90d supply, fill #0

## 2023-07-09 NOTE — Telephone Encounter (Signed)
Please contact Dr. Alwyn Ren  He wants to repeat a screening colonoscopy  He takes Plavix so needs an office visit w/ me, please  cell is [phone]   He will be travelling to  [place] 2/7-2/18 so I suggest an office visit after that - may use an 1130 or 350 spot if needed

## 2023-07-09 NOTE — Telephone Encounter (Signed)
Dr. Alwyn Ren made aware of Dr. Leone Payor recommendations: Dr. Alwyn Ren scheduled for an office visit to see Dr. Leone Payor on 08/02/2023 at 1:30 PM.  Dr. Alwyn Ren verbalized understanding with all questions answered.

## 2023-07-09 NOTE — Telephone Encounter (Signed)
-----   Message from [place] sent at 07/09/2023  7:39 AM EST ----- Can you make sure he has Metoprolol refills (and anything else he needs)? Also he needs follow up. He sent me an email. He can be seen any day last week of Feb or any day in March except 3/11. He usually gets labs w [facility] in March. So later in March would probably be the best so we can have his labs at the visit. He can see either me or [facility]. Thanks [employer]

## 2023-07-10 ENCOUNTER — Other Ambulatory Visit (HOSPITAL_COMMUNITY): Payer: Self-pay

## 2023-07-10 ENCOUNTER — Telehealth: Payer: Self-pay | Admitting: Cardiovascular Disease

## 2023-07-10 DIAGNOSIS — I25118 Atherosclerotic heart disease of native coronary artery with other forms of angina pectoris: Secondary | ICD-10-CM

## 2023-07-10 MED ORDER — METOPROLOL TARTRATE 50 MG PO TABS
50.0000 mg | ORAL_TABLET | Freq: Two times a day (BID) | ORAL | 0 refills | Status: DC
  Filled 2023-07-10 – 2023-07-31 (×2): qty 180, 90d supply, fill #0

## 2023-07-10 NOTE — Telephone Encounter (Signed)
*  STAT* If patient is at the pharmacy, call can be transferred to refill team.   1. Which medications need to be refilled? (please list name of each medication and dose if known)   metoprolol tartrate (LOPRESSOR) 50 MG tablet    2. Which pharmacy/location (including street and city if local pharmacy) is medication to be sent to?  [place] [facility]      3. Do they need a 30 day or 90 day supply? 90 day   Pt has office visit scheduled in March

## 2023-07-10 NOTE — Telephone Encounter (Signed)
Pt's medication was sent to pt's pharmacy as requested. Confirmation received.

## 2023-07-31 ENCOUNTER — Other Ambulatory Visit (HOSPITAL_COMMUNITY): Payer: Self-pay

## 2023-07-31 ENCOUNTER — Other Ambulatory Visit: Payer: Self-pay

## 2023-08-02 ENCOUNTER — Ambulatory Visit: Payer: PPO | Admitting: Internal Medicine

## 2023-08-02 ENCOUNTER — Telehealth: Payer: Self-pay

## 2023-08-02 ENCOUNTER — Encounter: Payer: Self-pay | Admitting: Internal Medicine

## 2023-08-02 VITALS — BP 130/72 | HR 59 | Ht 67.0 in | Wt 162.0 lb

## 2023-08-02 DIAGNOSIS — Z7902 Long term (current) use of antithrombotics/antiplatelets: Secondary | ICD-10-CM | POA: Diagnosis not present

## 2023-08-02 DIAGNOSIS — K625 Hemorrhage of anus and rectum: Secondary | ICD-10-CM

## 2023-08-02 DIAGNOSIS — K649 Unspecified hemorrhoids: Secondary | ICD-10-CM

## 2023-08-02 DIAGNOSIS — Z860101 Personal history of adenomatous and serrated colon polyps: Secondary | ICD-10-CM | POA: Diagnosis not present

## 2023-08-02 MED ORDER — SUFLAVE 178.7 G PO SOLR: 1.0000 | Freq: Once | ORAL | 0 refills | Status: AC

## 2023-08-02 NOTE — Telephone Encounter (Signed)
   Name: Robert Lawless, MD  DOB: Mar 10, 1946  MRN: [id number]  Primary Cardiologist: [name], MD  Chart reviewed as part of pre-operative protocol coverage. Because of Robert Lawless, MD's past medical history and time since last visit, he will require a follow-up in-office visit in order to better assess preoperative cardiovascular risk.  Pre-op covering staff: - Please schedule appointment and call patient to inform them. If patient already had an upcoming appointment within acceptable timeframe, please add "pre-op clearance" to the appointment notes so provider is aware. - Please contact requesting surgeon's office via preferred method (i.e, phone, fax) to inform them of need for appointment prior to surgery.  His last stent to RCA was back in 2021, should be okay to hold plavix x 5 days prior to colonoscopy. Would prefer to continue ASA throughout procedure.   Sharlene Dory, PA-C  08/02/2023, 4:04 PM

## 2023-08-02 NOTE — Telephone Encounter (Signed)
 Tried calling patient to schedule in person office visit no answer left a detailed message to call back and schedule

## 2023-08-02 NOTE — Progress Notes (Signed)
 Robert Lawless, MD 78 y.o. 02-21-1946 [id number]  Assessment & Plan:   Encounter Diagnoses  Name Primary?   Hx of adenomatous and sessile serrated colonic polyps Yes   Long term current use of antithrombotics/antiplatelets - clopidogrel     Schedule colonoscopy.  Risks benefits and indications reviewed with the patient. Hold clopidogrel 5 days prior to reduce risk of periprocedural bleeding.  Clarify with cardiology to make sure this is acceptable, likely so.     Subjective:   Chief Complaint: History of colon polyps, patient is on clopidogrel.  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  Diminutive sessile/serrated polyp in 2019 and 3 adenomas in 2016.  He is vigorous just having returned from a trip to [place].  More trips planned later this year.  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  Sometimes hemorrhoids will flare and will be slight bright red blood per rectum toilet paper.  No change in bowel habits other than he has noted increased in frequency and stool volume since increasing plants in his diet.  GI review of systems otherwise negative. Allergies  Allergen Reactions   Eugenol Other (See Comments)    Topical dental anesthetic caused migraine type headache   Current Meds  Medication Sig   albuterol (VENTOLIN HFA) 108 (90 Base) MCG/ACT inhaler Inhale 2 puffs into the lungs every 4-6 hours as needed for cough and wheezing   aspirin 81 MG tablet Take 81 mg by mouth daily.   clopidogrel (PLAVIX) 75 MG tablet Take 1 tablet (75 mg total) by mouth daily.   ezetimibe (ZETIA) 10 MG tablet Take 1 tablet (10 mg total) by mouth daily.   losartan (COZAAR) 50 MG tablet Take 1 tablet (50 mg total) by mouth daily.   metoprolol tartrate (LOPRESSOR) 50 MG tablet Take 1 tablet (50 mg total) by mouth 2 (two) times daily.   nitroGLYCERIN (NITROSTAT) 0.4 MG SL tablet Place  1 tablet (0.4 mg total) under the tongue every 5 (five) minutes as needed. Dissolve 1 tablet under the tongue every 5 minutes as needed for chest pain. Max of 3 doses, then 911.   rosuvastatin (CRESTOR) 40 MG tablet TAKE 1 TABLET BY MOUTH DAILY   SUFLAVE 178.7 g SOLR Take 1 kit by mouth once for 1 dose.   Past Medical History:  Diagnosis Date   Allergy    Bladder diverticulum    CAD (coronary artery disease) cardiologist-  dr cooper   NSTEMI 10/20/11: LHC at [facility] in [place], [place]: mRCA 70% and 80% hazy, dRCA 90%, EF 65%; >>  PCI: Promus DES x 2 (3x38 mm and 3x12 mm) to mid and distal RCA;  b.  LHC (6/14): mid and distal RCA stents patent // Cath 5/21: oLM 25, oLAD 30, mLAD 50 w side br in 2nd septal 50; D2 40, oLCx 40, mRCA stent ok w 30 ISR, RPDA 40; ost and prox RCA 90, 80 >> PCI: DES to ost/prox RCA    Cataract    removed bilaterally    Cervical disc disease    C 6 L radiculopathy on L   Cervical radiculopathy at C6    left side   Gross hematuria    Heart murmur    MVP   History of adenomatous polyp of colon    05-17-2015  tubular adenomas   History of non-ST elevation myocardial infarction (NSTEMI)    10-20-2011  s/p  cardiac cath in [place], [place]   HTN (hypertension)    Hx of adenomatous colonic polyps 05/23/2015   Hyperlipidemia    Myocardial infarction [facility])    NSTEMI- 2 stents    OA (osteoarthritis)    Positive PPD    S/P drug eluting coronary stent placement    10-21-2011  at [facility] in [place], [place]  DES x2 to distal and mid RCA   Seasonal allergies    Urgency of urination    Past Surgical History:  Procedure Laterality Date   CATARACT EXTRACTION W/ INTRAOCULAR LENS  IMPLANT, BILATERAL  2014  and  2016   COLONOSCOPY  last one 05-17-2015   CORONARY ANGIOPLASTY WITH STENT PLACEMENT  10-21-2011  in [place], [place]   DES to mid and distal RCA   CORONARY STENT INTERVENTION N/A 10/29/2019   Procedure: CORONARY STENT INTERVENTION;  Surgeon: [name], MD;  Location: [facility] INVASIVE CV LAB;  Service: Cardiovascular;  Laterality: N/A;   CYSTOSCOPY WITH FULGERATION N/A 02/17/2016   Procedure: CYSTOSCOPY with fulgeration of bleeders;  Surgeon: Ihor Gully, MD;  Location[facility];  Service: Urology;  Laterality: N/A;rigid and flexible    INGUINAL HERNIA REPAIR Left 06/20/2015   Procedure: LEFT INGUINAL HERNIA REPAIR WITH MESH;  Surgeon: Emelia Loron, MD;  Location: [facility];  Service: General;  Laterality: Left;   INSERTION OF MESH Left 06/20/2015   Procedure: INSERTION OF MESH;  Surgeon: Emelia Loron, MD;  Location: [facility];  Service: General;  Laterality: Left;   LEFT HEART CATH AND CORONARY ANGIOGRAPHY N/A 10/29/2019   Procedure: LEFT HEART CATH AND CORONARY ANGIOGRAPHY;  Surgeon: [name], MD;  Location: [facility] INVASIVE CV LAB;  Service: Cardiovascular;  Laterality: N/A;   LEFT HEART CATHETERIZATION WITH CORONARY ANGIOGRAM N/A 11/14/2012   Procedure: LEFT HEART CATHETERIZATION WITH CORONARY ANGIOGRAM;  Surgeon: [name], MD;  Location: [facility] CATH LAB;  Service: Cardiovascular;  Laterality: N/A;  patent mid and distal RCA stent's;  ostial CFX 30%,  mLAD 40-50%,  pPDA 40%,  ef55%   TONSILLECTOMY AND ADENOIDECTOMY  child   WISDOM TOOTH EXTRACTION  1972   Social History   Social History Narrative   Physician, prior primary care physician, 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  Anticipates full retirement 2025      Married to [place], adult children.   Occasional alcoholic beverage no tobacco or drug use   family history includes Alcohol abuse in his father and mother; Dementia in his paternal grandmother; Diabetes in his brother and sister; Heart attack (age of onset: 65) in his father; Rheum arthritis in his paternal grandfather.   Review of Systems As per HPI  Objective:   Physical Exam @BP  130/72   Pulse (!) 59   Ht 5\' 7"  (1.702 m)   Wt 162  lb (73.5 kg)   BMI 25.37 kg/m @  General:  NAD Eyes:   anicteric Lungs:  clear Heart::  S1S2 no rubs, murmurs or gallops Abdomen:  soft and nontender, BS+     Data Reviewed:  See HPI

## 2023-08-02 NOTE — Telephone Encounter (Signed)
 Request for surgical clearance:     Endoscopy Procedure  What type of surgery is being performed?     Colonoscopy   When is this surgery scheduled?     08/19/23  What type of clearance is required ?   Pharmacy  Are there any medications that need to be held prior to surgery and how long? Plavix (x5 days)  Practice name and name of physician performing surgery?      [facility]  What is your office phone and fax number?      Phone- [phone]  Fax- [phone]  Anesthesia type (None, local, MAC, general) ?       MAC  Please route your response to [facility]

## 2023-08-02 NOTE — Patient Instructions (Addendum)
 You have been scheduled for a colonoscopy. Please follow written instructions given to you at your visit today.   If you use inhalers (even only as needed), please bring them with you on the day of your procedure.  DO NOT TAKE 7 DAYS PRIOR TO TEST- Trulicity (dulaglutide) Ozempic, Wegovy (semaglutide) Mounjaro (tirzepatide) Bydureon Bcise (exanatide extended release)  DO NOT TAKE 1 DAY PRIOR TO YOUR TEST Rybelsus (semaglutide) Adlyxin (lixisenatide) Victoza (liraglutide) Byetta (exanatide) ___________________________________________________________________________  Bonita Quin will receive your bowel preparation through [facility], which ensures the lowest copay and home delivery, with outreach via text or call from an [phone] number. Please respond promptly to avoid rescheduling of your procedure. If you are interested in alternative options or have any questions regarding your prep, please contact them at [phone] ____________________________________________________________________________  Your Provider Has Sent Your Bowel Prep Regimen To [facility]   [facility] will contact you to verify your information and collect your copay, if applicable. Enjoy the comfort of your home while your prescription is mailed to you, FREE of any shipping charges.   [facility] accepts all major insurance benefits and applies discounts & coupons.  Have additional questions?   Chat: [email or web address] Call: [phone] Email: [email or web address] NCPDP: [id number]  How will [facility] contact you?  With a Welcome phone call,  a Welcome text and a checkout link in text form.  Texts you receive from [phone] Are NOT Spam.  *To set up delivery, you must complete the checkout process via link or speak to one of the patient care representatives. If [facility] is unable to reach you, your prescription may be delayed.  To avoid long hold times on the phone, you may also utilize the secure chat  feature on the [facility] website to request that they call you back for transaction completion or to expedite your concerns.   Due to recent changes in healthcare laws, you may see the results of your imaging and laboratory studies on MyChart before your provider has had a chance to review them.  We understand that in some cases there may be results that are confusing or concerning to you. Not all laboratory results come back in the same time frame and the provider may be waiting for multiple results in order to interpret others.  Please give [place] 48 hours in order for your provider to thoroughly review all the results before contacting the office for clarification of your results.   _______________________________________________________  If your blood pressure at your visit was 140/90 or greater, please contact your primary care physician to follow up on this.  _______________________________________________________  If you are age 35 or older, your body mass index should be between 23-30. Your Body mass index is 25.37 kg/m. If this is out of the aforementioned range listed, please consider follow up with your Primary Care Provider.  If you are age 13 or younger, your body mass index should be between 19-25. Your Body mass index is 25.37 kg/m. If this is out of the aformentioned range listed, please consider follow up with your Primary Care Provider.   ________________________________________________________  The [facility] providers would like to encourage you to use [facility] to communicate with providers for non-urgent requests or questions.  Due to long hold times on the telephone, sending your provider a message by [facility] may be a faster and more efficient way to get a response.  Please allow 48 business hours for a response.  Please remember that this is for non-urgent requests.  _______________________________________________________  Thank you for  choosing me and [facility].  Dr. Stan Head

## 2023-08-05 NOTE — Telephone Encounter (Signed)
 I s/w the pt and have moved pre op clearance appt up sooner due to procedure date 08/19/23. Pt agreeable to be seen at the [facility] office with [facility] 08/12/23 1:55. Pt thanked me for the help.   Will let pt d/w provider 08/12/23 if 3/26 appt is still needed.

## 2023-08-07 ENCOUNTER — Ambulatory Visit: Payer: PPO | Admitting: Physician Assistant

## 2023-08-08 ENCOUNTER — Other Ambulatory Visit (HOSPITAL_COMMUNITY): Payer: Self-pay

## 2023-08-08 ENCOUNTER — Encounter: Payer: Self-pay | Admitting: Internal Medicine

## 2023-08-08 ENCOUNTER — Telehealth: Payer: Self-pay | Admitting: Internal Medicine

## 2023-08-08 NOTE — Telephone Encounter (Signed)
 Inbound call from patient, calling wishing to speak to a nurse in regards to prep medication. Patient states his insurance does not consider [facility] a pharmacy and therefore cannot cover his prep medication. He states he would like to discuss more options.

## 2023-08-08 NOTE — Telephone Encounter (Signed)
 Called and spoke with patient he would rather have prep that he had last time, which was Miralax. Updated Instructions will be sent to patient via my-chart.

## 2023-08-09 NOTE — Progress Notes (Signed)
 Cardiology Clinic Note   Patient Name: Robert Lawless, Robert Owen Date of Encounter: 08/12/2023  Primary Care Provider:  Cleatis Polka., Robert Owen Primary Cardiologist:  [name], Robert Owen  Patient Profile    Robert Lawless, Robert Owen 78 year old male presents the clinic today for follow-up evaluation of his coronary artery disease, hypertension, and preoperative cardiac evaluation.  Past Medical History    Past Medical History:  Diagnosis Date   Allergy    Bladder diverticulum    CAD (coronary artery disease) cardiologist-  dr cooper   NSTEMI 10/20/11: LHC at [facility] in [place], [place]: mRCA 70% and 80% hazy, dRCA 90%, EF 65%; >>  PCI: Promus DES x 2 (3x38 mm and 3x12 mm) to mid and distal RCA;  b.  LHC (6/14): mid and distal RCA stents patent // Cath 5/21: oLM 25, oLAD 30, mLAD 50 w side br in 2nd septal 50; D2 40, oLCx 40, mRCA stent ok w 30 ISR, RPDA 40; ost and prox RCA 90, 80 >> PCI: DES to ost/prox RCA    Cataract    removed bilaterally    Cervical disc disease    C 6 L radiculopathy on L   Cervical radiculopathy at C6    left side   Gross hematuria    Heart murmur    MVP   History of adenomatous polyp of colon    05-17-2015  tubular adenomas   History of non-ST elevation myocardial infarction (NSTEMI)    10-20-2011  s/p  cardiac cath in [place], [place]   HTN (hypertension)    Hx of adenomatous colonic polyps 05/23/2015   Hyperlipidemia    Myocardial infarction [facility])    NSTEMI- 2 stents    OA (osteoarthritis)    Positive PPD    S/P drug eluting coronary stent placement    10-21-2011  at [facility] in [place], [place]  DES x2 to distal and mid RCA   Seasonal allergies    Urgency of urination    Past Surgical History:  Procedure Laterality Date   CATARACT EXTRACTION W/ INTRAOCULAR LENS  IMPLANT, BILATERAL  2014  and  2016   COLONOSCOPY  last one 05-17-2015   CORONARY ANGIOPLASTY WITH STENT PLACEMENT  10-21-2011  in [place], [place]   DES to mid and distal RCA    CORONARY STENT INTERVENTION N/A 10/29/2019   Procedure: CORONARY STENT INTERVENTION;  Surgeon: [name], Robert Owen;  Location[facility] INVASIVE CV LAB;  Service: Cardiovascular;  Laterality: N/A;   CYSTOSCOPY WITH FULGERATION N/A 02/17/2016   Procedure: CYSTOSCOPY with fulgeration of bleeders;  Surgeon: Ihor Gully, Robert Owen;  Location: [facility];  Service: Urology;  Laterality: N/A;rigid and flexible    INGUINAL HERNIA REPAIR Left 06/20/2015   Procedure: LEFT INGUINAL HERNIA REPAIR WITH MESH;  Surgeon: Emelia Loron, Robert Owen;  Location: [facility];  Service: General;  Laterality: Left;   INSERTION OF MESH Left 06/20/2015   Procedure: INSERTION OF MESH;  Surgeon: Emelia Loron, Robert Owen;  Location: [facility];  Service: General;  Laterality: Left;   LEFT HEART CATH AND CORONARY ANGIOGRAPHY N/A 10/29/2019   Procedure: LEFT HEART CATH AND CORONARY ANGIOGRAPHY;  Surgeon: [name], Robert Owen;  Location: [facility] INVASIVE CV LAB;  Service: Cardiovascular;  Laterality: N/A;   LEFT HEART CATHETERIZATION WITH CORONARY ANGIOGRAM N/A 11/14/2012   Procedure: LEFT HEART CATHETERIZATION WITH CORONARY ANGIOGRAM;  Surgeon: [name], Robert Owen;  Location: [facility] CATH LAB;  Service: Cardiovascular;  Laterality: N/A;  patent mid  and distal RCA stent's;  ostial CFX 30%,  mLAD 40-50%,  pPDA 40%,  ef55%   TONSILLECTOMY AND ADENOIDECTOMY  child   WISDOM TOOTH EXTRACTION  1972    Allergies  Allergies  Allergen Reactions   Eugenol Other (See Comments)    Topical dental anesthetic caused migraine type headache    History of Present Illness    Robert Lawless, Robert Owen has a PMH of coronary artery disease status post NSTEMI 5/13 in [facility] with PCI and DES x 2 to his mid and distal RCA.  He had subsequent left heart cath 6/14 which showed patent RCA stents.  He had catheterization 5/21 which showed ostial RCA 90%, proximal RCA 80%, medial RCA patent stent and received PCI with DES to his  ostial and proximal RCA.  His PMH also includes HTN, HLD, cervical DDD, carotid stenosis by ultrasound 6/23 with bilateral ICA 1-39%.  He was seen in follow-up by Dr. Excell Seltzer on 05/23/2022.  During that time he was doing well.  He denied chest pain and shortness of breath.  He reported compliance with his medications.  His blood pressure was noted to be elevated.  He reported that he had not yet taken his blood pressure medication because he had not eaten.  His blood pressure was well-controlled at home.  He presents to the clinic today for follow-up evaluation and preoperative cardiac evaluation.  He states he is working with Ricarda Frame.  He is planning to retire in May.  We reviewed his previous cardiac catheterizations and his upcoming colonoscopy.  His blood pressure is 140/66 and he is on losartan and metoprolol tartrate.  He has been very physically active walking around 7000 steps per day.  Today he denies chest pain, shortness of breath, lower extremity edema, fatigue, palpitations, melena, hematuria, hemoptysis, diaphoresis, weakness, presyncope, syncope, orthopnea, and PND.    Home Medications    Prior to Admission medications   Medication Sig Start Date End Date Taking? Authorizing Provider  albuterol (VENTOLIN HFA) 108 (90 Base) MCG/ACT inhaler Inhale 2 puffs into the lungs every 4-6 hours as needed for cough and wheezing 05/30/23     aspirin 81 MG tablet Take 81 mg by mouth daily.    Provider, Historical, Robert Owen  cefdinir (OMNICEF) 300 MG capsule Take 1 capsule (300 mg total) by mouth 2 (two) times daily as directed for 7 days Patient not taking: Reported on 08/02/2023 05/30/23     clopidogrel (PLAVIX) 75 MG tablet Take 1 tablet (75 mg total) by mouth daily. 01/02/23   [name], Robert Owen  ezetimibe (ZETIA) 10 MG tablet Take 1 tablet (10 mg total) by mouth daily. 07/01/23     losartan (COZAAR) 50 MG tablet Take 1 tablet (50 mg total) by mouth daily. 11/19/22      metoprolol tartrate (LOPRESSOR) 50 MG tablet Take 1 tablet (50 mg total) by mouth 2 (two) times daily. 07/10/23   [name], Robert Owen  molnupiravir EUA (LAGEVRIO) 200 MG CAPS capsule Take 4 capsules (800 mg total) by mouth every 12 (twelve) hours for 5 days Patient not taking: Reported on 08/02/2023 07/11/22     nitroGLYCERIN (NITROSTAT) 0.4 MG SL tablet Place 1 tablet (0.4 mg total) under the tongue every 5 (five) minutes as needed. Dissolve 1 tablet under the tongue every 5 minutes as needed for chest pain. Max of 3 doses, then 911. 05/23/22   [name], Robert Owen  predniSONE (DELTASONE) 20 MG tablet Take 2 tablets (40 mg  total) by mouth daily for 6 days Patient not taking: Reported on 08/02/2023 05/30/23     rosuvastatin (CRESTOR) 40 MG tablet TAKE 1 TABLET BY MOUTH DAILY 07/09/23   Beatrice Lecher, PA-C    Family History    Family History  Problem Relation Age of Onset   Heart attack Father 34       2 ppd smoker, died at 50 of MI   Alcohol abuse Father    Alcohol abuse Mother    Dementia Paternal Grandmother        in 37s   Rheum arthritis Paternal Grandfather    Diabetes Sister        weight related   Diabetes Brother        NIDDM   Colon cancer Neg Hx    Colon polyps Neg Hx    Esophageal cancer Neg Hx    Rectal cancer Neg Hx    Stomach cancer Neg Hx    He indicated that his mother is deceased. He indicated that his father is deceased. He indicated that the status of his sister is unknown. He indicated that the status of his brother is unknown. He indicated that his maternal grandmother is deceased. He indicated that his maternal grandfather is deceased. He indicated that his paternal grandmother is deceased. He indicated that his paternal grandfather is deceased. He indicated that the status of his neg hx is unknown.  Social History    Social History   Socioeconomic History   Marital status: Married    Spouse name: Not on file   Number of children: Not on file   Years of  education: Not on file   Highest education level: Not on file  Occupational History   Not on file  Tobacco Use   Smoking status: Never   Smokeless tobacco: Never  Vaping Use   Vaping status: Never Used  Substance and Sexual Activity   Alcohol use: Yes    Alcohol/week: 0.0 standard drinks of alcohol    Comment: OCCASIONAL   Drug use: No   Sexual activity: Not on file  Other Topics Concern   Not on file  Social History Narrative   Physician, prior primary care physician, 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  Anticipates full retirement 2025      Married to [place], adult children.   Occasional alcoholic beverage no tobacco or drug use   Social Drivers of Corporate investment banker Strain: Not on file  Food Insecurity: Not on file  Transportation Needs: Not on file  Physical Activity: Not on file  Stress: Not on file  Social Connections: Not on file  Intimate Partner Violence: Not on file     Review of Systems    General:  No chills, fever, night sweats or weight changes.  Cardiovascular:  No chest pain, dyspnea on exertion, edema, orthopnea, palpitations, paroxysmal nocturnal dyspnea. Dermatological: No rash, lesions/masses Respiratory: No cough, dyspnea Urologic: No hematuria, dysuria Abdominal:   No nausea, vomiting, diarrhea, bright red blood per rectum, melena, or hematemesis Neurologic:  No visual changes, wkns, changes in mental status. All other systems reviewed and are otherwise negative except as noted above.  Physical Exam    VS:  BP (!) 140/66   Ht 5\' 7"  (1.702 m)   Wt 160 lb 9.6 oz (72.8 kg)   SpO2 97%   BMI 25.15 kg/m  , BMI Body mass index is 25.15 kg/m. GEN: Well nourished, well developed, in  no acute distress. HEENT: normal. Neck: Supple, no JVD, carotid bruits, or masses. Cardiac: RRR, no murmurs, rubs, or gallops. No clubbing, cyanosis, edema.  Radials/DP/PT 2+ and equal bilaterally.  Respiratory:  Respirations  regular and unlabored, clear to auscultation bilaterally. GI: Soft, nontender, nondistended, BS + x 4. MS: no deformity or atrophy. Skin: warm and dry, no rash. Neuro:  Strength and sensation are intact. Psych: Normal affect.  Accessory Clinical Findings    Recent Labs: No results found for requested labs within last 365 days.   Recent Lipid Panel    Component Value Date/Time   CHOL 154 06/30/2012 1414   TRIG 125.0 06/30/2012 1414   HDL 50.40 06/30/2012 1414   CHOLHDL 3 06/30/2012 1414   VLDL 25.0 06/30/2012 1414   LDLCALC 79 06/30/2012 1414    HYPERTENSION CONTROL Vitals:   08/12/23 1419 08/12/23 1639  BP: (!) 140/66 (!) 140/66    The patient's blood pressure is elevated above target today.  In order to address the patient's elevated BP: Blood pressure will be monitored at home to determine if medication changes need to be made.       ECG personally reviewed by me today- EKG Interpretation Date/Time:  Monday August 12 2023 14:16:41 EST Ventricular Rate:  60 PR Interval:  274 QRS Duration:  114 QT Interval:  432 QTC Calculation: 432 R Axis:   75  Text Interpretation: Sinus rhythm with 1st degree A-V block Minimal voltage criteria for LVH, may be normal variant ( Cornell product ) Confirmed by Edd Fabian [phone]) on 08/12/2023 2:45:57 PM    Nuclear stress test 12/06/2021    Findings are consistent with no prior ischemia. The study is intermediate risk.   horizontal ST depression (V4 and V5) was noted.   Left ventricular function is normal. Nuclear stress EF: 64 %. The left ventricular ejection fraction is normal (55-65%). End diastolic cavity size is normal. End systolic cavity size is normal.   Prior study not available for comparison.   IMPRESSIONS PACs before and after study. One 4 beat run of SVT in recovery Lateral horizontal ST depressions at peak stress with T wave inversions, positive ECG stress test Normal left ventricular function. Normal LV  size There is an apical perfusion defect that improves with stress with normal wall motion consistent with artifact.  There is a basal inferior perfusion defect that resolves with stress with normal wall motion.  There is diaphragmatic attenuation in rest that improves with stress.  Artifact suspected.   CONCLUSIONS No ischemia noted Over all negative study. Discordance between positive ECG and negative perfusion test, and presence of attenuation artifact decreases sensitivity of the study; intermediate risk test.       Assessment & Plan   1.  Coronary artery disease-no chest pain today.  Denies anginal type symptoms.  Cardiac catheterization 5/21 showed patent mid RCA stent with the proximal and ostial RCA stenosis.  He received PCI with DES x 1.  He was noted to have mild-moderate diffuse stenosis through his LAD and circumflex which was unchanged from previous catheterization.  His EF was noted to be 50-55%.  He was continued on dual antiplatelet therapy. Heart healthy low-sodium diet Continue aspirin, Plavix, ezetimibe, losartan, metoprolol, nitroglycerin as needed, and rosuvastatin  Hyperlipidemia-LDL 57 on 01/15/2022. High-fiber diet Maintain physical activity Continue rosuvastatin, aspirin, Plavix   Essential hypertension-BP today 140/66. Contained blood pressure log Heart healthy low-sodium diet Continue metoprolol, losartan Has lab work drawn with provider today  Preoperative cardiac evaluation-colonoscopy,  endoscopy, 08/19/2023, [facility], fax #[phone]    Primary Cardiologist: [name], Robert Owen  Chart reviewed as part of pre-operative protocol coverage. Given past medical history and time since last visit, based on ACC/AHA guidelines, Robert Lawless, Robert Owen would be at acceptable risk for the planned procedure without further cardiovascular testing.   Patient was advised that if he develops new symptoms prior to surgery to contact our office to arrange a  follow-up appointment.  He verbalized understanding.  His Plavix may be held for 5 days prior to his procedure.  Please resume as soon as hemostasis is achieved.  His aspirin should be continued throughout the perioperative period.  I will route this recommendation to the requesting party via Epic fax function and remove from pre-op pool.      Disposition: Follow-up with Dr. Excell Seltzer or me in 12 months.         Thomasene Ripple. Clare Fennimore NP-C     08/12/2023, 4:40 PM [facility] [address] Office [phone] Fax [phone]    I spent 14 minutes examining this patient, reviewing medications, and using patient centered shared decision making involving their cardiac care.   I spent  20 minutes reviewing past medical history,  medications, and prior cardiac tests.

## 2023-08-12 ENCOUNTER — Ambulatory Visit: Payer: PPO | Attending: General Practice | Admitting: General Practice

## 2023-08-12 ENCOUNTER — Encounter: Payer: Self-pay | Admitting: General Practice

## 2023-08-12 VITALS — BP 140/66 | Ht 67.0 in | Wt 160.6 lb

## 2023-08-12 DIAGNOSIS — E785 Hyperlipidemia, unspecified: Secondary | ICD-10-CM | POA: Diagnosis not present

## 2023-08-12 DIAGNOSIS — Z01818 Encounter for other preprocedural examination: Secondary | ICD-10-CM | POA: Diagnosis not present

## 2023-08-12 DIAGNOSIS — I10 Essential (primary) hypertension: Secondary | ICD-10-CM | POA: Diagnosis not present

## 2023-08-12 DIAGNOSIS — I25118 Atherosclerotic heart disease of native coronary artery with other forms of angina pectoris: Secondary | ICD-10-CM | POA: Diagnosis not present

## 2023-08-12 NOTE — Patient Instructions (Signed)
 Medication Instructions:  The current medical regimen is effective;  continue present plan and medications as directed. Please refer to the Current Medication list given to you today.  *If you need a refill on your cardiac medications before your next appointment, please call your pharmacy*  Lab Work: NONE  Other Instructions OK FOR EGD/COLONOSCOPY-HOLD PLAVIX 5 DAYS PRIOR PLEASE READ AND FOLLOW ATTACHED  SALTY 6   Follow-Up: At [facility], you and your health needs are our priority.  As part of our continuing mission to provide you with exceptional heart care, we have created designated Provider Care Teams.  These Care Teams include your primary Cardiologist (physician) and Advanced Practice Providers (APPs -  Physician Assistants and Nurse Practitioners) who all work together to provide you with the care you need, when you need it.  Your next appointment:   12 month(s)  Provider:   [name], MD

## 2023-08-16 NOTE — Telephone Encounter (Signed)
 Patient was informed on 08/12/23 by Cardiology- okay to hold Plavix x 5 days prior to procedure.

## 2023-08-19 ENCOUNTER — Ambulatory Visit (AMBULATORY_SURGERY_CENTER): Payer: PPO | Admitting: Internal Medicine

## 2023-08-19 ENCOUNTER — Encounter: Payer: Self-pay | Admitting: Internal Medicine

## 2023-08-19 VITALS — BP 127/57 | HR 60 | Temp 98.0°F | Resp 10 | Ht 67.0 in | Wt 162.0 lb

## 2023-08-19 DIAGNOSIS — Z1211 Encounter for screening for malignant neoplasm of colon: Secondary | ICD-10-CM | POA: Diagnosis not present

## 2023-08-19 DIAGNOSIS — N400 Enlarged prostate without lower urinary tract symptoms: Secondary | ICD-10-CM

## 2023-08-19 DIAGNOSIS — Z860101 Personal history of adenomatous and serrated colon polyps: Secondary | ICD-10-CM

## 2023-08-19 MED ORDER — SODIUM CHLORIDE 0.9 % IV SOLN: 500.0000 mL | Freq: Once | INTRAVENOUS | Status: DC

## 2023-08-19 NOTE — Progress Notes (Signed)
 Pt A/O x 3, gd SR's, pleased with anesthesia, report to RN

## 2023-08-19 NOTE — Progress Notes (Signed)
 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   Hx of adenomatous colonic polyps Yes  .  The various methods of evaluation and treatment have been discussed with the patient and/or family. After consideration of risks, benefits and other options for treatment, the patient has consented to  the endoscopic procedure(s).   The patient's history has been reviewed, patient examined, no change in status, stable for endoscopic procedure(s).  I have reviewed the patient's chart and labs.  Questions were answered to the patient's satisfaction.     Iva Boop, MD, Clementeen Graham

## 2023-08-19 NOTE — Op Note (Signed)
 [facility] Patient Name: Robert Owen Procedure Date: 08/19/2023 7:17 AM MRN: [id number] Endoscopist: Iva Boop , MD, [id number] Age: 78 Referring MD:  Date of Birth: 1945/12/06 Gender: Male Account #: [id number] Procedure:                Colonoscopy Indications:              Surveillance: Personal history of colonic polyps                            (unknown histology) on last colonoscopy more than 5                            years ago, Last colonoscopy: 2019 Medicines:                Monitored Anesthesia Care Procedure:                Pre-Anesthesia Assessment:                           - Prior to the procedure, a History and Physical                            was performed, and patient medications and                            allergies were reviewed. The patient's tolerance of                            previous anesthesia was also reviewed. The risks                            and benefits of the procedure and the sedation                            options and risks were discussed with the patient.                            All questions were answered, and informed consent                            was obtained. Prior Anticoagulants: The patient                            last took Plavix (clopidogrel) 5 days prior to the                            procedure. ASA Grade Assessment: III - A patient                            with severe systemic disease. After reviewing the                            risks and benefits, the patient was deemed in  satisfactory condition to undergo the procedure.                           After obtaining informed consent, the colonoscope                            was passed under direct vision. Throughout the                            procedure, the patient's blood pressure, pulse, and                            oxygen saturations were monitored continuously. The                            Olympus  Scope SN: [id number] was introduced through                            the anus and advanced to the the cecum, identified                            by appendiceal orifice and ileocecal valve. The                            colonoscopy was performed without difficulty. The                            patient tolerated the procedure well. The quality                            of the bowel preparation was adequate. The                            ileocecal valve, appendiceal orifice, and rectum                            were photographed. The bowel preparation used was                            Miralax via split dose instruction. Scope In: 8:07:06 AM Scope Out: 8:21:44 AM Scope Withdrawal Time: 0 hours 10 minutes 59 seconds  Total Procedure Duration: 0 hours 14 minutes 38 seconds  Findings:                 The digital rectal exam findings include enlarged                            prostate. Pertinent negatives include no palpable                            rectal lesions.                           The exam was otherwise without abnormality on  direct and retroflexion views. Complications:            No immediate complications. Estimated Blood Loss:     Estimated blood loss: none. Impression:               - Enlarged prostate found on digital rectal exam.                           - The examination was otherwise normal on direct                            and retroflexion views.                           - No specimens collected.                           - Personal history of colonic polyps. Diminutive                            sessile/serrated polyp in 2019 and 3 adenomas in                            2016 Recommendation:           - Patient has a contact number available for                            emergencies. The signs and symptoms of potential                            delayed complications were discussed with the                            patient.  Return to normal activities tomorrow.                            Written discharge instructions were provided to the                            patient.                           - Resume previous diet.                           - Continue present medications.                           - Resume Plavix (clopidogrel) at prior dose today.                           - No repeat colonoscopy due to age and the absence                            of colonic polyps. Iva Boop, MD 08/19/2023 8:33:54 AM This report has been signed electronically.

## 2023-08-19 NOTE — Patient Instructions (Addendum)
 Please read handouts provided. Continue present medications. Resume Plavix ( clopidogrel ) at prior dose today. Resume previous diet.   YOU HAD AN ENDOSCOPIC PROCEDURE TODAY AT THE [facility]:   Refer to the procedure report that was given to you for any specific questions about what was found during the examination.  If the procedure report does not answer your questions, please call your gastroenterologist to clarify.  If you requested that your care partner not be given the details of your procedure findings, then the procedure report has been included in a sealed envelope for you to review at your convenience later.  YOU SHOULD EXPECT: Some feelings of bloating in the abdomen. Passage of more gas than usual.  Walking can help get rid of the air that was put into your GI tract during the procedure and reduce the bloating. If you had a lower endoscopy (such as a colonoscopy or flexible sigmoidoscopy) you may notice spotting of blood in your stool or on the toilet paper. If you underwent a bowel prep for your procedure, you may not have a normal bowel movement for a few days.  Please Note:  You might notice some irritation and congestion in your nose or some drainage.  This is from the oxygen used during your procedure.  There is no need for concern and it should clear up in a day or so.  SYMPTOMS TO REPORT IMMEDIATELY:  Following lower endoscopy (colonoscopy or flexible sigmoidoscopy):  Excessive amounts of blood in the stool  Significant tenderness or worsening of abdominal pains  Swelling of the abdomen that is new, acute  Fever of 100F or higher.  For urgent or emergent issues, a gastroenterologist can be reached at any hour by calling [phone]. Do not use MyChart messaging for urgent concerns.    DIET:  We do recommend a small meal at first, but then you may proceed to your regular diet.  Drink plenty of fluids but you should avoid alcoholic beverages for 24  hours.  ACTIVITY:  You should plan to take it easy for the rest of today and you should NOT DRIVE or use heavy machinery until tomorrow (because of the sedation medicines used during the test).    FOLLOW UP: Our staff will call the number listed on your records the next business day following your procedure.  We will call around 7:15- 8:00 am to check on you and address any questions or concerns that you may have regarding the information given to you following your procedure. If we do not reach you, we will leave a message.     If any biopsies were taken you will be contacted by phone or by letter within the next 1-3 weeks.  Please call us at [phone] if you have not heard about the biopsies in 3 weeks.    SIGNATURES/CONFIDENTIALITY: You and/or your care partner have signed paperwork which will be entered into your electronic medical record.  These signatures attest to the fact that that the information above on your After Visit Summary has been reviewed and is understood.  Full responsibility of the confidentiality of this discharge information lies with you and/or your care-partner.No polyps or cancer seen!  No need for routine repeat colonoscopy - but would for signs and symptoms as needed (hope not).  I appreciate the opportunity to care for you. Iva Boop, MD, [facility]

## 2023-08-20 ENCOUNTER — Telehealth: Payer: Self-pay

## 2023-08-20 NOTE — Telephone Encounter (Signed)
  Follow up Call-     08/19/2023    7:25 AM  Call back number  Post procedure Call Back phone  # [phone]  Permission to leave phone message Yes     Patient questions:  Do you have a fever, pain , or abdominal swelling? No. Pain Score  0 *  Have you tolerated food without any problems? Yes.    Have you been able to return to your normal activities? Yes.    Do you have any questions about your discharge instructions: Diet   No. Medications  No. Follow up visit  No.  Do you have questions or concerns about your Care? No.  Actions: * If pain score is 4 or above: No action needed, pain <4.

## 2023-09-04 ENCOUNTER — Ambulatory Visit: Payer: PPO | Admitting: Physician Assistant

## 2023-09-30 ENCOUNTER — Other Ambulatory Visit (HOSPITAL_COMMUNITY): Payer: Self-pay

## 2023-10-25 ENCOUNTER — Other Ambulatory Visit: Payer: Self-pay

## 2023-10-29 ENCOUNTER — Telehealth: Payer: Self-pay | Admitting: Cardiovascular Disease

## 2023-10-29 ENCOUNTER — Encounter (HOSPITAL_COMMUNITY): Payer: Self-pay

## 2023-10-29 ENCOUNTER — Other Ambulatory Visit (HOSPITAL_COMMUNITY): Payer: Self-pay

## 2023-10-29 ENCOUNTER — Other Ambulatory Visit: Payer: Self-pay | Admitting: Cardiovascular Disease

## 2023-10-29 DIAGNOSIS — I25118 Atherosclerotic heart disease of native coronary artery with other forms of angina pectoris: Secondary | ICD-10-CM

## 2023-10-29 DIAGNOSIS — I2510 Atherosclerotic heart disease of native coronary artery without angina pectoris: Secondary | ICD-10-CM | POA: Diagnosis not present

## 2023-10-29 DIAGNOSIS — D7589 Other specified diseases of blood and blood-forming organs: Secondary | ICD-10-CM | POA: Diagnosis not present

## 2023-10-29 DIAGNOSIS — I6523 Occlusion and stenosis of bilateral carotid arteries: Secondary | ICD-10-CM | POA: Diagnosis not present

## 2023-10-29 DIAGNOSIS — I10 Essential (primary) hypertension: Secondary | ICD-10-CM | POA: Diagnosis not present

## 2023-10-29 DIAGNOSIS — E785 Hyperlipidemia, unspecified: Secondary | ICD-10-CM | POA: Diagnosis not present

## 2023-10-29 DIAGNOSIS — F1020 Alcohol dependence, uncomplicated: Secondary | ICD-10-CM | POA: Diagnosis not present

## 2023-10-29 DIAGNOSIS — Z1339 Encounter for screening examination for other mental health and behavioral disorders: Secondary | ICD-10-CM | POA: Diagnosis not present

## 2023-10-29 DIAGNOSIS — Z1331 Encounter for screening for depression: Secondary | ICD-10-CM | POA: Diagnosis not present

## 2023-10-29 DIAGNOSIS — R7301 Impaired fasting glucose: Secondary | ICD-10-CM | POA: Diagnosis not present

## 2023-10-29 DIAGNOSIS — Z0000 Encounter for general adult medical examination without abnormal findings: Secondary | ICD-10-CM | POA: Diagnosis not present

## 2023-10-29 DIAGNOSIS — M898X1 Other specified disorders of bone, shoulder: Secondary | ICD-10-CM | POA: Diagnosis not present

## 2023-10-29 DIAGNOSIS — M50123 Cervical disc disorder at C6-C7 level with radiculopathy: Secondary | ICD-10-CM | POA: Diagnosis not present

## 2023-10-29 DIAGNOSIS — Z860101 Personal history of adenomatous and serrated colon polyps: Secondary | ICD-10-CM | POA: Diagnosis not present

## 2023-10-29 DIAGNOSIS — R82998 Other abnormal findings in urine: Secondary | ICD-10-CM | POA: Diagnosis not present

## 2023-10-29 DIAGNOSIS — R809 Proteinuria, unspecified: Secondary | ICD-10-CM | POA: Diagnosis not present

## 2023-10-29 DIAGNOSIS — N323 Diverticulum of bladder: Secondary | ICD-10-CM | POA: Diagnosis not present

## 2023-10-29 MED ORDER — METOPROLOL TARTRATE 50 MG PO TABS
50.0000 mg | ORAL_TABLET | Freq: Two times a day (BID) | ORAL | 3 refills | Status: AC
  Filled 2023-10-29: qty 180, 90d supply, fill #0
  Filled 2024-01-28: qty 180, 90d supply, fill #1
  Filled 2024-05-04: qty 180, 90d supply, fill #2

## 2023-10-29 MED ORDER — NITROGLYCERIN 0.4 MG SL SUBL
0.4000 mg | SUBLINGUAL_TABLET | SUBLINGUAL | 11 refills | Status: AC | PRN
Start: 2023-10-29 — End: ?
  Filled 2023-10-29: qty 25, 15d supply, fill #0

## 2023-10-29 MED ORDER — LOSARTAN POTASSIUM 50 MG PO TABS
50.0000 mg | ORAL_TABLET | Freq: Every day | ORAL | 3 refills | Status: AC
  Filled 2023-10-29 – 2023-11-05 (×4): qty 90, 90d supply, fill #0
  Filled 2024-03-03: qty 90, 90d supply, fill #1
  Filled 2024-06-07: qty 90, 90d supply, fill #2

## 2023-10-29 NOTE — Telephone Encounter (Signed)
 Pt's medication was sent to pt's pharmacy as requested. Confirmation received.

## 2023-10-29 NOTE — Telephone Encounter (Signed)
*  STAT* If patient is at the pharmacy, call can be transferred to refill team.   1. Which medications need to be refilled? (please list name of each medication and dose if known) metoprolol  tartrate (LOPRESSOR ) 50 MG tablet   2. Which pharmacy/location (including street and city if local pharmacy) is medication to be sent to? [place] [facility]   3. Do they need a 30 day or 90 day supply? [place]

## 2023-10-31 ENCOUNTER — Other Ambulatory Visit (HOSPITAL_COMMUNITY): Payer: Self-pay

## 2023-11-04 ENCOUNTER — Other Ambulatory Visit (HOSPITAL_COMMUNITY): Payer: Self-pay

## 2023-11-05 ENCOUNTER — Other Ambulatory Visit (HOSPITAL_COMMUNITY): Payer: Self-pay

## 2023-11-05 ENCOUNTER — Other Ambulatory Visit: Payer: Self-pay

## 2023-12-25 ENCOUNTER — Other Ambulatory Visit: Payer: Self-pay | Admitting: Physician Assistant

## 2023-12-25 ENCOUNTER — Other Ambulatory Visit (HOSPITAL_COMMUNITY): Payer: Self-pay

## 2023-12-26 ENCOUNTER — Other Ambulatory Visit (HOSPITAL_COMMUNITY): Payer: Self-pay

## 2023-12-26 MED ORDER — ROSUVASTATIN CALCIUM 40 MG PO TABS
40.0000 mg | ORAL_TABLET | Freq: Every day | ORAL | 3 refills | Status: AC
  Filled 2023-12-26: qty 90, 90d supply, fill #0
  Filled 2024-03-27: qty 90, 90d supply, fill #1
  Filled 2024-06-25: qty 90, 90d supply, fill #2

## 2024-01-06 ENCOUNTER — Other Ambulatory Visit (HOSPITAL_COMMUNITY): Payer: Self-pay

## 2024-01-06 ENCOUNTER — Other Ambulatory Visit: Payer: Self-pay | Admitting: Cardiovascular Disease

## 2024-01-07 ENCOUNTER — Other Ambulatory Visit (HOSPITAL_COMMUNITY): Payer: Self-pay

## 2024-01-07 ENCOUNTER — Other Ambulatory Visit: Payer: Self-pay

## 2024-01-07 MED ORDER — CLOPIDOGREL BISULFATE 75 MG PO TABS
75.0000 mg | ORAL_TABLET | Freq: Every day | ORAL | 0 refills | Status: DC
  Filled 2024-01-07: qty 30, 30d supply, fill #0

## 2024-01-08 ENCOUNTER — Other Ambulatory Visit (HOSPITAL_COMMUNITY): Payer: Self-pay

## 2024-02-04 ENCOUNTER — Other Ambulatory Visit (HOSPITAL_COMMUNITY): Payer: Self-pay

## 2024-02-04 ENCOUNTER — Other Ambulatory Visit: Payer: Self-pay | Admitting: Cardiovascular Disease

## 2024-02-04 MED ORDER — CLOPIDOGREL BISULFATE 75 MG PO TABS
75.0000 mg | ORAL_TABLET | Freq: Every day | ORAL | 1 refills | Status: AC
  Filled 2024-02-04: qty 90, 90d supply, fill #0
  Filled 2024-05-04: qty 90, 90d supply, fill #1

## 2024-02-19 DIAGNOSIS — D7589 Other specified diseases of blood and blood-forming organs: Secondary | ICD-10-CM | POA: Diagnosis not present

## 2024-02-19 DIAGNOSIS — H6121 Impacted cerumen, right ear: Secondary | ICD-10-CM | POA: Diagnosis not present

## 2024-02-19 DIAGNOSIS — H9191 Unspecified hearing loss, right ear: Secondary | ICD-10-CM | POA: Diagnosis not present

## 2024-02-19 DIAGNOSIS — E538 Deficiency of other specified B group vitamins: Secondary | ICD-10-CM | POA: Diagnosis not present

## 2024-03-18 DIAGNOSIS — H52203 Unspecified astigmatism, bilateral: Secondary | ICD-10-CM | POA: Diagnosis not present

## 2024-03-18 DIAGNOSIS — Z961 Presence of intraocular lens: Secondary | ICD-10-CM | POA: Diagnosis not present

## 2024-03-18 DIAGNOSIS — H26493 Other secondary cataract, bilateral: Secondary | ICD-10-CM | POA: Diagnosis not present

## 2024-03-27 ENCOUNTER — Other Ambulatory Visit (HOSPITAL_COMMUNITY): Payer: Self-pay

## 2024-05-04 ENCOUNTER — Other Ambulatory Visit: Payer: Self-pay

## 2024-05-04 ENCOUNTER — Other Ambulatory Visit (HOSPITAL_COMMUNITY): Payer: Self-pay

## 2024-05-05 ENCOUNTER — Other Ambulatory Visit (HOSPITAL_COMMUNITY): Payer: Self-pay

## 2024-05-15 ENCOUNTER — Other Ambulatory Visit (HOSPITAL_COMMUNITY): Payer: Self-pay

## 2024-07-11 ENCOUNTER — Other Ambulatory Visit (HOSPITAL_COMMUNITY): Payer: Self-pay

## 2024-07-11 MED ORDER — EZETIMIBE 10 MG PO TABS
10.0000 mg | ORAL_TABLET | Freq: Every day | ORAL | 2 refills | Status: AC
  Filled 2024-07-11: qty 90, 90d supply, fill #0
